# Patient Record
Sex: Female | Born: 1970 | Race: White | Hispanic: No | Marital: Single | Smoking: Former smoker
Health system: Southern US, Community
[De-identification: ages and names within clinical notes are randomized; demographics above are authoritative.]

---

## 2011-02-23 ENCOUNTER — Ambulatory Visit: Payer: Self-pay | Admitting: Internal Medicine

## 2011-03-04 ENCOUNTER — Encounter: Payer: Self-pay | Admitting: Family Medicine

## 2011-03-04 ENCOUNTER — Ambulatory Visit (INDEPENDENT_AMBULATORY_CARE_PROVIDER_SITE_OTHER): Payer: BC Managed Care – PPO | Admitting: Family Medicine

## 2011-03-04 ENCOUNTER — Other Ambulatory Visit (HOSPITAL_COMMUNITY)
Admission: RE | Admit: 2011-03-04 | Discharge: 2011-03-04 | Disposition: A | Discharge status: Home / Self Care | Payer: BC Managed Care – PPO | Source: Ambulatory Visit | Attending: Family Medicine | Admitting: Family Medicine

## 2011-03-04 VITALS — BP 110/80 | HR 76 | Temp 97.8°F | Ht 66.0 in | Wt 129.5 lb

## 2011-03-04 DIAGNOSIS — Z01419 Encounter for gynecological examination (general) (routine) without abnormal findings: Secondary | ICD-10-CM | POA: Insufficient documentation

## 2011-03-04 DIAGNOSIS — Z1231 Encounter for screening mammogram for malignant neoplasm of breast: Secondary | ICD-10-CM

## 2011-03-04 DIAGNOSIS — Z136 Encounter for screening for cardiovascular disorders: Secondary | ICD-10-CM

## 2011-03-04 DIAGNOSIS — Z1159 Encounter for screening for other viral diseases: Secondary | ICD-10-CM | POA: Insufficient documentation

## 2011-03-04 DIAGNOSIS — Z0000 Encounter for general adult medical examination without abnormal findings: Secondary | ICD-10-CM | POA: Insufficient documentation

## 2011-03-04 NOTE — Progress Notes (Signed)
40 yo here to establish care and for CPX/Pap.  No complaints.  G0.  No h/o abnormal pap smears. Grandmother had ovarian CA in her 42s, otherwise no other cancer runs in her family that she is aware of. Has never had a mammogram.  ROS: Patient reports no  vision/ hearing changes,anorexia, weight change, fever ,adenopathy, persistant / recurrent hoarseness, swallowing issues, chest pain, edema,persistant / recurrent cough, hemoptysis, dyspnea(rest, exertional, paroxysmal nocturnal), gastrointestinal  bleeding (melena, rectal bleeding), abdominal pain, excessive heart burn, GU symptoms(dysuria, hematuria, pyuria, voiding/incontinence  Issues) syncope, focal weakness, severe memory loss, concerning skin lesions, depression, anxiety, abnormal bruising/bleeding, major joint swelling, breast masses or abnormal vaginal bleeding.    The PMH, PSH, Social History, Family History, Medications, and allergies have been reviewed in [facility], and have been updated if relevant.  Physical exam: BP 110/80  Pulse 76  Temp(Src) 97.8 F (36.6 C) (Oral)  Ht 5\' 6"  (1.676 m)  Wt 129 lb 8 oz (58.741 kg)  BMI 20.90 kg/m2  LMP 02/26/2011  General:  Well-developed,well-nourished,in no acute distress; alert,appropriate and cooperative throughout examination Head:  normocephalic and atraumatic.   Eyes:  vision grossly intact, pupils equal, pupils round, and pupils reactive to light.   Ears:  R ear normal and L ear normal.   Nose:  no external deformity.   Mouth:  good dentition.   Neck:  No deformities, masses, or tenderness noted. Breasts:  No mass, nodules, thickening, tenderness, bulging, retraction, inflamation, nipple discharge or skin changes noted.   Lungs:  Normal respiratory effort, chest expands symmetrically. Lungs are clear to auscultation, no crackles or wheezes. Heart:  Normal rate and regular rhythm. S1 and S2 normal without gallop, murmur, click, rub or other extra sounds. Abdomen:  Bowel sounds  positive,abdomen soft and non-tender without masses, organomegaly or hernias noted. Rectal:  no external abnormalities.   Genitalia:  Pelvic Exam:        External: normal female genitalia without lesions or masses        Vagina: normal without lesions or masses        Cervix: normal without lesions or masses        Adnexa: normal bimanual exam without masses or fullness        Uterus: normal by palpation        Pap smear: performed Msk:  No deformity or scoliosis noted of thoracic or lumbar spine.   Extremities:  No clubbing, cyanosis, edema, or deformity noted with normal full range of motion of all joints.   Neurologic:  alert & oriented X3 and gait normal.   Skin:  Intact without suspicious lesions or rashes Cervical Nodes:  No lymphadenopathy noted Axillary Nodes:  No palpable lymphadenopathy Psych:  Cognition and judgment appear intact. Alert and cooperative with normal attention span and concentration. No apparent delusions, illusions, hallucinations  Assessment and Plan: 1. Routine general medical examination at a health care facility   Reviewed preventive care protocols, scheduled due services, and updated immunizations Discussed nutrition, exercise, diet, and healthy lifestyle. Orders Placed This Encounter  Procedures  . MM Digital Screening  . Lipid panel  . Basic Metabolic Panel (BMET)

## 2011-03-04 NOTE — Patient Instructions (Signed)
It was wonderful to meet you. Please stop by to see Morgan Whitaker on your way out to set up your mammogram. We will contact you with results of your pap smear and lab work.

## 2011-03-05 LAB — BASIC METABOLIC PANEL
BUN: 11 mg/dL (ref 6–23)
CO2: 27 mEq/L (ref 19–32)
Calcium: 9.4 mg/dL (ref 8.4–10.5)
Creatinine, Ser: 0.8 mg/dL (ref 0.4–1.2)
Glucose, Bld: 92 mg/dL (ref 70–99)

## 2011-03-05 LAB — LIPID PANEL
HDL: 56.4 mg/dL (ref >39.00)
Total CHOL/HDL Ratio: 3
Triglycerides: 80 mg/dL (ref 0.0–149.0)

## 2011-03-12 ENCOUNTER — Encounter: Payer: Self-pay | Admitting: *Deleted

## 2011-04-01 ENCOUNTER — Ambulatory Visit: Payer: Self-pay | Admitting: Family Medicine

## 2011-04-02 ENCOUNTER — Encounter: Payer: Self-pay | Admitting: *Deleted

## 2011-04-05 ENCOUNTER — Encounter: Payer: Self-pay | Admitting: Family Medicine

## 2011-12-21 ENCOUNTER — Encounter: Payer: Self-pay | Admitting: Family Medicine

## 2011-12-21 ENCOUNTER — Ambulatory Visit (INDEPENDENT_AMBULATORY_CARE_PROVIDER_SITE_OTHER): Payer: BC Managed Care – PPO | Admitting: Family Medicine

## 2011-12-21 VITALS — BP 100/72 | HR 72 | Temp 97.9°F | Ht 66.5 in | Wt 135.0 lb

## 2011-12-21 DIAGNOSIS — D239 Other benign neoplasm of skin, unspecified: Secondary | ICD-10-CM

## 2011-12-21 DIAGNOSIS — Z1231 Encounter for screening mammogram for malignant neoplasm of breast: Secondary | ICD-10-CM

## 2011-12-21 DIAGNOSIS — D229 Melanocytic nevi, unspecified: Secondary | ICD-10-CM

## 2011-12-21 DIAGNOSIS — Z0000 Encounter for general adult medical examination without abnormal findings: Secondary | ICD-10-CM

## 2011-12-21 DIAGNOSIS — Z136 Encounter for screening for cardiovascular disorders: Secondary | ICD-10-CM

## 2011-12-21 LAB — COMPREHENSIVE METABOLIC PANEL
AST: 20 U/L (ref 0–37)
Alkaline Phosphatase: 38 U/L — ABNORMAL LOW (ref 39–117)
BUN: 15 mg/dL (ref 6–23)
Creatinine, Ser: 0.8 mg/dL (ref 0.4–1.2)

## 2011-12-21 LAB — LIPID PANEL
HDL: 59.5 mg/dL (ref >39.00)
LDL Cholesterol: 107 mg/dL — ABNORMAL HIGH (ref 0–99)
Total CHOL/HDL Ratio: 3
Triglycerides: 78 mg/dL (ref 0.0–149.0)

## 2011-12-21 MED ORDER — VALACYCLOVIR HCL 1 G PO TABS: 1000.0000 mg | ORAL_TABLET | Freq: Two times a day (BID) | ORAL | Status: AC

## 2011-12-21 NOTE — Progress Notes (Signed)
41 yo here  for CPX/Pap.  Doing well.  Due for labs today.  G0.  No h/o abnormal pap smears.  Normal pap smear last year.  Grandmother had ovarian CA in her 29s, otherwise no other cancer runs in her family that she is aware of.  Multiple moles- she feels some of them are changing - getting larger.   Lab Results  Component Value Date   CHOL 158 03/04/2011   HDL 56.40 03/04/2011   LDLCALC 86 03/04/2011   TRIG 80.0 03/04/2011   CHOLHDL 3 03/04/2011     Patient Active Problem List  Diagnoses  . Routine general medical examination at a health care facility   No past medical history on file. No past surgical history on file. History  Substance Use Topics  . Smoking status: Current Some Day Smoker  . Smokeless tobacco: Not on file  . Alcohol Use: Not on file   Family History  Problem Relation Age of Onset  . Cancer Maternal Grandmother 19    ovarian   No Known Allergies No current outpatient prescriptions on file prior to visit.   The PMH, PSH, Social History, Family History, Medications, and allergies have been reviewed in [facility], and have been updated if relevant.   ROS: Patient reports no  vision/ hearing changes,anorexia, weight change, fever ,adenopathy, persistant / recurrent hoarseness, swallowing issues, chest pain, edema,persistant / recurrent cough, hemoptysis, dyspnea(rest, exertional, paroxysmal nocturnal), gastrointestinal  bleeding (melena, rectal bleeding), abdominal pain, excessive heart burn, GU symptoms(dysuria, hematuria, pyuria, voiding/incontinence  Issues) syncope, focal weakness, severe memory loss, concerning skin lesions, depression, anxiety, abnormal bruising/bleeding, major joint swelling, breast masses or abnormal vaginal bleeding.    The PMH, PSH, Social History, Family History, Medications, and allergies have been reviewed in [facility], and have been updated if relevant.  Physical exam: BP 100/72  Pulse 72  Temp(Src) 97.9 F (36.6 C) (Oral)  Ht 5' 6.5"  (1.689 m)  Wt 135 lb (61.236 kg)  BMI 21.46 kg/m2  LMP 12/12/2011  General:  Well-developed,well-nourished,in no acute distress; alert,appropriate and cooperative throughout examination Head:  normocephalic and atraumatic.   Eyes:  vision grossly intact, pupils equal, pupils round, and pupils reactive to light.   Ears:  R ear normal and L ear normal.   Nose:  no external deformity.   Mouth:  good dentition.   Neck:  No deformities, masses, or tenderness noted. Breasts:  No mass, nodules, thickening, tenderness, bulging, retraction, inflamation, nipple discharge or skin changes noted.   Lungs:  Normal respiratory effort, chest expands symmetrically. Lungs are clear to auscultation, no crackles or wheezes. Heart:  Normal rate and regular rhythm. S1 and S2 normal without gallop, murmur, click, rub or other extra sounds. Abdomen:  Bowel sounds positive,abdomen soft and non-tender without masses, organomegaly or hernias noted. Msk:  No deformity or scoliosis noted of thoracic or lumbar spine.   Extremities:  No clubbing, cyanosis, edema, or deformity noted with normal full range of motion of all joints.   Neurologic:  alert & oriented X3 and gait normal.   Skin:  Multiple moles- some appear dysplastic Cervical Nodes:  No lymphadenopathy noted Axillary Nodes:  No palpable lymphadenopathy Psych:  Cognition and judgment appear intact. Alert and cooperative with normal attention span and concentration. No apparent delusions, illusions, hallucinations  Assessment and Plan:  1. Routine general medical examination at a health care facility  Reviewed preventive care protocols, scheduled due services, and updated immunizations Discussed nutrition, exercise, diet, and healthy lifestyle.  Comprehensive  metabolic panel MM Digital Screening Lipid Panel  2. Atypical nevi  Ambulatory referral to Dermatology

## 2011-12-21 NOTE — Patient Instructions (Addendum)
Great to see you. Please stop by to see Morgan Whitaker on your way out to set up your mammogram. We will call you with your lab results.  Preventive Care for Adults, Female A healthy lifestyle and preventive care can promote health and wellness. Preventive health guidelines for women include the following key practices.  A routine yearly physical is a good way to check with your caregiver about your health and preventive screening. It is a chance to share any concerns and updates on your health, and to receive a thorough exam.   Visit your dentist for a routine exam and preventive care every 6 months. Brush your teeth twice a day and floss once a day. Good oral hygiene prevents tooth decay and gum disease.   The frequency of eye exams is based on your age, health, family medical history, use of contact lenses, and other factors. Follow your caregiver's recommendations for frequency of eye exams.   Eat a healthy diet. Foods like vegetables, fruits, whole grains, low-fat dairy products, and lean protein foods contain the nutrients you need without too many calories. Decrease your intake of foods high in solid fats, added sugars, and salt. Eat the right amount of calories for you.Get information about a proper diet from your caregiver, if necessary.   Regular physical exercise is one of the most important things you can do for your health. Most adults should get at least 150 minutes of moderate-intensity exercise (any activity that increases your heart rate and causes you to sweat) each week. In addition, most adults need muscle-strengthening exercises on 2 or more days a week.   Maintain a healthy weight. The body mass index (BMI) is a screening tool to identify possible weight problems. It provides an estimate of body fat based on height and weight. Your caregiver can help determine your BMI, and can help you achieve or maintain a healthy weight.For adults 20 years and older:   A BMI below 18.5 is  considered underweight.   A BMI of 18.5 to 24.9 is normal.   A BMI of 25 to 29.9 is considered overweight.   A BMI of 30 and above is considered obese.   Maintain normal blood lipids and cholesterol levels by exercising and minimizing your intake of saturated fat. Eat a balanced diet with plenty of fruit and vegetables. Blood tests for lipids and cholesterol should begin at age 19 and be repeated every 5 years. If your lipid or cholesterol levels are high, you are over 50, or you are at high risk for heart disease, you may need your cholesterol levels checked more frequently.Ongoing high lipid and cholesterol levels should be treated with medicines if diet and exercise are not effective.   If you smoke, find out from your caregiver how to quit. If you do not use tobacco, do not start.   If you are pregnant, do not drink alcohol. If you are breastfeeding, be very cautious about drinking alcohol. If you are not pregnant and choose to drink alcohol, do not exceed 1 drink per day. One drink is considered to be 12 ounces (355 mL) of beer, 5 ounces (148 mL) of wine, or 1.5 ounces (44 mL) of liquor.   Avoid use of street drugs. Do not share needles with anyone. Ask for help if you need support or instructions about stopping the use of drugs.   High blood pressure causes heart disease and increases the risk of stroke. Your blood pressure should be checked at least every 1  to 2 years. Ongoing high blood pressure should be treated with medicines if weight loss and exercise are not effective.   If you are 33 to 41 years old, ask your caregiver if you should take aspirin to prevent strokes.   Diabetes screening involves taking a blood sample to check your fasting blood sugar level. This should be done once every 3 years, after age 73, if you are within normal weight and without risk factors for diabetes. Testing should be considered at a younger age or be carried out more frequently if you are overweight  and have at least 1 risk factor for diabetes.   Breast cancer screening is essential preventive care for women. You should practice "breast self-awareness." This means understanding the normal appearance and feel of your breasts and may include breast self-examination. Any changes detected, no matter how small, should be reported to a caregiver. Women in their 8s and 30s should have a clinical breast exam (CBE) by a caregiver as part of a regular health exam every 1 to 3 years. After age 48, women should have a CBE every year. Starting at age 34, women should consider having a mammography (breast X-ray test) every year. Women who have a family history of breast cancer should talk to their caregiver about genetic screening. Women at a high risk of breast cancer should talk to their caregivers about having magnetic resonance imaging (MRI) and a mammography every year.   The Pap test is a screening test for cervical cancer. A Pap test can show cell changes on the cervix that might become cervical cancer if left untreated. A Pap test is a procedure in which cells are obtained and examined from the lower end of the uterus (cervix).   Women should have a Pap test starting at age 89.   Between ages 4 and 56, Pap tests should be repeated every 2 years.   Beginning at age 75, you should have a Pap test every 3 years as long as the past 3 Pap tests have been normal.   Some women have medical problems that increase the chance of getting cervical cancer. Talk to your caregiver about these problems. It is especially important to talk to your caregiver if a new problem develops soon after your last Pap test. In these cases, your caregiver may recommend more frequent screening and Pap tests.   The above recommendations are the same for women who have or have not gotten the vaccine for human papillomavirus (HPV).   If you had a hysterectomy for a problem that was not cancer or a condition that could lead to  cancer, then you no longer need Pap tests. Even if you no longer need a Pap test, a regular exam is a good idea to make sure no other problems are starting.   If you are between ages 54 and 61, and you have had normal Pap tests going back 10 years, you no longer need Pap tests. Even if you no longer need a Pap test, a regular exam is a good idea to make sure no other problems are starting.   If you have had past treatment for cervical cancer or a condition that could lead to cancer, you need Pap tests and screening for cancer for at least 20 years after your treatment.   If Pap tests have been discontinued, risk factors (such as a new sexual partner) need to be reassessed to determine if screening should be resumed.   The HPV test  is an additional test that may be used for cervical cancer screening. The HPV test looks for the virus that can cause the cell changes on the cervix. The cells collected during the Pap test can be tested for HPV. The HPV test could be used to screen women aged 64 years and older, and should be used in women of any age who have unclear Pap test results. After the age of 45, women should have HPV testing at the same frequency as a Pap test.   Colorectal cancer can be detected and often prevented. Most routine colorectal cancer screening begins at the age of 39 and continues through age 12. However, your caregiver may recommend screening at an earlier age if you have risk factors for colon cancer. On a yearly basis, your caregiver may provide home test kits to check for hidden blood in the stool. Use of a small camera at the end of a tube, to directly examine the colon (sigmoidoscopy or colonoscopy), can detect the earliest forms of colorectal cancer. Talk to your caregiver about this at age 11, when routine screening begins. Direct examination of the colon should be repeated every 5 to 10 years through age 20, unless early forms of pre-cancerous polyps or small growths are found.     Hepatitis C blood testing is recommended for all people born from [birth year] through 1965 and any individual with known risks for hepatitis C.   Practice safe sex. Use condoms and avoid high-risk sexual practices to reduce the spread of sexually transmitted infections (STIs). STIs include gonorrhea, chlamydia, syphilis, trichomonas, herpes, HPV, and human immunodeficiency virus (HIV). Herpes, HIV, and HPV are viral illnesses that have no cure. They can result in disability, cancer, and death. Sexually active women aged 43 and younger should be checked for chlamydia. Older women with new or multiple partners should also be tested for chlamydia. Testing for other STIs is recommended if you are sexually active and at increased risk.   Osteoporosis is a disease in which the bones lose minerals and strength with aging. This can result in serious bone fractures. The risk of osteoporosis can be identified using a bone density scan. Women ages 51 and over and women at risk for fractures or osteoporosis should discuss screening with their caregivers. Ask your caregiver whether you should take a calcium supplement or vitamin D to reduce the rate of osteoporosis.   Menopause can be associated with physical symptoms and risks. Hormone replacement therapy is available to decrease symptoms and risks. You should talk to your caregiver about whether hormone replacement therapy is right for you.   Use sunscreen with sun protection factor (SPF) of 30 or more. Apply sunscreen liberally and repeatedly throughout the day. You should seek shade when your shadow is shorter than you. Protect yourself by wearing long sleeves, pants, a wide-brimmed hat, and sunglasses year round, whenever you are outdoors.   Once a month, do a whole body skin exam, using a mirror to look at the skin on your back. Notify your caregiver of new moles, moles that have irregular borders, moles that are larger than a pencil eraser, or moles that have  changed in shape or color.   Stay current with required immunizations.   Influenza. You need a dose every fall (or winter). The composition of the flu vaccine changes each year, so being vaccinated once is not enough.   Pneumococcal polysaccharide. You need 1 to 2 doses if you smoke cigarettes or if you have certain  chronic medical conditions. You need 1 dose at age 53 (or older) if you have never been vaccinated.   Tetanus, diphtheria, pertussis (Tdap, Td). Get 1 dose of Tdap vaccine if you are younger than age 89, are over 31 and have contact with an infant, are a Research scientist (physical sciences), are pregnant, or simply want to be protected from whooping cough. After that, you need a Td booster dose every 10 years. Consult your caregiver if you have not had at least 3 tetanus and diphtheria-containing shots sometime in your life or have a deep or dirty wound.   HPV. You need this vaccine if you are a woman age 68 or younger. The vaccine is given in 3 doses over 6 months.   Measles, mumps, rubella (MMR). You need at least 1 dose of MMR if you were born in 1957 or later. You may also need a second dose.   Meningococcal. If you are age 11 to 50 and a first-year college student living in a residence hall, or have one of several medical conditions, you need to get vaccinated against meningococcal disease. You may also need additional booster doses.   Zoster (shingles). If you are age 38 or older, you should get this vaccine.   Varicella (chickenpox). If you have never had chickenpox or you were vaccinated but received only 1 dose, talk to your caregiver to find out if you need this vaccine.   Hepatitis A. You need this vaccine if you have a specific risk factor for hepatitis A virus infection or you simply wish to be protected from this disease. The vaccine is usually given as 2 doses, 6 to 18 months apart.   Hepatitis B. You need this vaccine if you have a specific risk factor for hepatitis B virus infection  or you simply wish to be protected from this disease. The vaccine is given in 3 doses, usually over 6 months.    Ages 72 to 72  Blood pressure check.** / Every 1 to 2 years.   Lipid and cholesterol check.** / Every 5 years beginning at age 87.   Clinical breast exam.** / Every year after age 97.   Mammogram.** / Every year beginning at age 18 and continuing for as long as you are in good health. Consult with your caregiver.   Pap test.** / Every 3 years starting at age 47 through age 59 or 67 with a history of 3 consecutive normal Pap tests.   HPV screening.** / Every 3 years from ages 30 through ages 62 to 42 with a history of 3 consecutive normal Pap tests.   Fecal occult blood test (FOBT) of stool. / Every year beginning at age 41 and continuing until age 14. You may not need to do this test if you get a colonoscopy every 10 years.   Flexible sigmoidoscopy or colonoscopy.** / Every 5 years for a flexible sigmoidoscopy or every 10 years for a colonoscopy beginning at age 101 and continuing until age 55.   Hepatitis C blood test.** / For all people born from [birth year] through 1965 and any individual with known risks for hepatitis C.   Skin self-exam. / Monthly.   Influenza immunization.** / Every year.   Pneumococcal polysaccharide immunization.** / 1 to 2 doses if you smoke cigarettes or if you have certain chronic medical conditions.   Tetanus, diphtheria, pertussis (Tdap, Td) immunization.** / A one-time dose of Tdap vaccine. After that, you need a Td booster dose every 10 years.  Measles, mumps, rubella (MMR) immunization. / You need at least 1 dose of MMR if you were born in 1957 or later. You may also need a second dose.   Varicella immunization.** / Consult your caregiver.   Meningococcal immunization.** / Consult your caregiver.   Hepatitis A immunization.** / Consult your caregiver. 2 doses, 6 to 18 months apart.   Hepatitis B immunization.** / Consult your caregiver. 3  doses, usually over 6 months.  Ages 84 and over  Blood pressure check.** / Every 1 to 2 years.   Lipid and cholesterol check.** / Every 5 years beginning at age 59.   Clinical breast exam.** / Every year after age 8.   Mammogram.** / Every year beginning at age 82 and continuing for as long as you are in good health. Consult with your caregiver.   Pap test.** / Every 3 years starting at age 76 through age 64 or 11 with a 3 consecutive normal Pap tests. Testing can be stopped between 65 and 70 with 3 consecutive normal Pap tests and no abnormal Pap or HPV tests in the past 10 years.   HPV screening.** / Every 3 years from ages 56 through ages 57 or 2 with a history of 3 consecutive normal Pap tests. Testing can be stopped between 65 and 70 with 3 consecutive normal Pap tests and no abnormal Pap or HPV tests in the past 10 years.   Fecal occult blood test (FOBT) of stool. / Every year beginning at age 58 and continuing until age 64. You may not need to do this test if you get a colonoscopy every 10 years.   Flexible sigmoidoscopy or colonoscopy.** / Every 5 years for a flexible sigmoidoscopy or every 10 years for a colonoscopy beginning at age 52 and continuing until age 42.   Hepatitis C blood test.** / For all people born from [birth year] through 1965 and any individual with known risks for hepatitis C.   Osteoporosis screening.** / A one-time screening for women ages 38 and over and women at risk for fractures or osteoporosis.   Skin self-exam. / Monthly.   Influenza immunization.** / Every year.   Pneumococcal polysaccharide immunization.** / 1 dose at age 61 (or older) if you have never been vaccinated.   Tetanus, diphtheria, pertussis (Tdap, Td) immunization. / A one-time dose of Tdap vaccine if you are over 65 and have contact with an infant, are a Research scientist (physical sciences), or simply want to be protected from whooping cough. After that, you need a Td booster dose every 10 years.   Varicella  immunization.** / Consult your caregiver.   Meningococcal immunization.** / Consult your caregiver.   Hepatitis A immunization.** / Consult your caregiver. 2 doses, 6 to 18 months apart.   Hepatitis B immunization.** / Check with your caregiver. 3 doses, usually over 6 months.  ** Family history and personal history of risk and conditions may change your caregiver's recommendations. Document Released: 10/19/2001 Document Revised: 08/12/2011 Document Reviewed: 01/18/2011 [facility] Patient Information 2012 [place], [place].

## 2012-04-04 ENCOUNTER — Ambulatory Visit: Payer: Self-pay | Admitting: Family Medicine

## 2012-04-05 ENCOUNTER — Encounter: Payer: Self-pay | Admitting: Family Medicine

## 2012-04-06 ENCOUNTER — Encounter: Payer: Self-pay | Admitting: *Deleted

## 2012-04-06 ENCOUNTER — Encounter: Payer: Self-pay | Admitting: Family Medicine

## 2012-04-06 LAB — HM MAMMOGRAPHY: HM Mammogram: NORMAL

## 2013-03-28 ENCOUNTER — Telehealth: Payer: Self-pay

## 2013-03-28 DIAGNOSIS — Z1231 Encounter for screening mammogram for malignant neoplasm of breast: Secondary | ICD-10-CM

## 2013-03-28 NOTE — Telephone Encounter (Signed)
Pt request screening mammogram to be ordered prior to 06/05/13 CPX. Heather at [facility] said since pt has not been seen since 12/2011 will need referral from doctor. Pt will wait to hear from pt care coordinator.

## 2013-03-28 NOTE — Telephone Encounter (Signed)
Referral placed.

## 2013-05-17 ENCOUNTER — Ambulatory Visit: Payer: Self-pay | Admitting: Family Medicine

## 2013-05-18 ENCOUNTER — Encounter: Payer: Self-pay | Admitting: Family Medicine

## 2013-05-18 ENCOUNTER — Encounter: Payer: Self-pay | Admitting: *Deleted

## 2013-05-28 ENCOUNTER — Encounter: Payer: Self-pay | Admitting: Family Medicine

## 2013-06-05 ENCOUNTER — Encounter: Payer: Self-pay | Admitting: Family Medicine

## 2013-06-05 ENCOUNTER — Ambulatory Visit (INDEPENDENT_AMBULATORY_CARE_PROVIDER_SITE_OTHER): Payer: BC Managed Care – PPO | Admitting: Family Medicine

## 2013-06-05 ENCOUNTER — Other Ambulatory Visit (HOSPITAL_COMMUNITY)
Admission: RE | Admit: 2013-06-05 | Discharge: 2013-06-05 | Disposition: A | Discharge status: Home / Self Care | Payer: BC Managed Care – PPO | Source: Ambulatory Visit | Attending: Family Medicine | Admitting: Family Medicine

## 2013-06-05 VITALS — BP 106/78 | HR 87 | Temp 97.7°F | Wt 141.2 lb

## 2013-06-05 DIAGNOSIS — Z136 Encounter for screening for cardiovascular disorders: Secondary | ICD-10-CM

## 2013-06-05 DIAGNOSIS — Z23 Encounter for immunization: Secondary | ICD-10-CM

## 2013-06-05 DIAGNOSIS — Z0000 Encounter for general adult medical examination without abnormal findings: Secondary | ICD-10-CM

## 2013-06-05 DIAGNOSIS — Z01419 Encounter for gynecological examination (general) (routine) without abnormal findings: Secondary | ICD-10-CM | POA: Insufficient documentation

## 2013-06-05 LAB — CBC WITH DIFFERENTIAL/PLATELET
Basophils Relative: 0.6 % (ref 0.0–3.0)
Eosinophils Absolute: 0.2 10*3/uL (ref 0.0–0.7)
MCHC: 33.8 g/dL (ref 30.0–36.0)
MCV: 96.9 fl (ref 78.0–100.0)
Monocytes Absolute: 0.6 10*3/uL (ref 0.1–1.0)
Neutrophils Relative %: 63.3 % (ref 43.0–77.0)
Platelets: 318 10*3/uL (ref 150.0–400.0)
RDW: 12.5 % (ref 11.5–14.6)

## 2013-06-05 LAB — LIPID PANEL
LDL Cholesterol: 107 mg/dL — ABNORMAL HIGH (ref 0–99)
Total CHOL/HDL Ratio: 4

## 2013-06-05 LAB — COMPREHENSIVE METABOLIC PANEL
ALT: 13 U/L (ref 0–35)
AST: 18 U/L (ref 0–37)
Albumin: 4.4 g/dL (ref 3.5–5.2)
Calcium: 9.3 mg/dL (ref 8.4–10.5)
Chloride: 104 mEq/L (ref 96–112)
Potassium: 4.1 mEq/L (ref 3.5–5.1)
Total Protein: 7.9 g/dL (ref 6.0–8.3)

## 2013-06-05 LAB — LUTEINIZING HORMONE: LH: 11.55 m[IU]/mL

## 2013-06-05 NOTE — Addendum Note (Signed)
Addended by: Damita Lack on: 06/05/2013 09:45 AM   Modules accepted: Orders

## 2013-06-05 NOTE — Patient Instructions (Addendum)
Great to see you. We will call you with your lab results.  You can also view them online.   

## 2013-06-05 NOTE — Progress Notes (Signed)
42 yo here  for CPX.  Doing well.  Due for labs today.  G0.  No h/o abnormal pap smears. Sexually active with her partner only.  Her partner has noticed that her periods seem to be more irregular lately.  Normal pap smear in 02/2011 (done by me). Negative mammogram 05/20/2013.  Grandmother had ovarian CA in her 64s, otherwise no other cancer runs in her family that she is aware of.  Has dermatologist that she sees every 6 months.  Lab Results  Component Value Date   CHOL 182 12/21/2011   HDL 59.50 12/21/2011   LDLCALC 107* 12/21/2011   TRIG 78.0 12/21/2011   CHOLHDL 3 12/21/2011     Patient Active Problem List   Diagnosis Date Noted  . Routine general medical examination at a health care facility 03/04/2011   No past medical history on file. No past surgical history on file. History  Substance Use Topics  . Smoking status: Former Games developer  . Smokeless tobacco: Not on file     Comment: Quit 12/12/11  . Alcohol Use: Not on file   Family History  Problem Relation Age of Onset  . Cancer Maternal Grandmother 31    ovarian   No Known Allergies No current outpatient prescriptions on file prior to visit.   No current facility-administered medications on file prior to visit.   The PMH, PSH, Social History, Family History, Medications, and allergies have been reviewed in [facility], and have been updated if relevant.   ROS: Patient reports no  vision/ hearing changes,anorexia, weight change, fever ,adenopathy, persistant / recurrent hoarseness, swallowing issues, chest pain, edema,persistant / recurrent cough, hemoptysis, dyspnea(rest, exertional, paroxysmal nocturnal), gastrointestinal  bleeding (melena, rectal bleeding), abdominal pain, excessive heart burn, GU symptoms(dysuria, hematuria, pyuria, voiding/incontinence  Issues) syncope, focal weakness, severe memory loss, concerning skin lesions, depression, anxiety, abnormal bruising/bleeding, major joint swelling, breast masses or abnormal  vaginal bleeding.    The PMH, PSH, Social History, Family History, Medications, and allergies have been reviewed in [facility], and have been updated if relevant.  Physical exam: BP 106/78  Pulse 87  Temp(Src) 97.7 F (36.5 C) (Oral)  Wt 141 lb 4 oz (64.071 kg)  BMI 22.46 kg/m2  SpO2 97%  LMP 05/28/2013   General:  Well-developed,well-nourished,in no acute distress; alert,appropriate and cooperative throughout examination Head:  normocephalic and atraumatic.   Eyes:  vision grossly intact, pupils equal, pupils round, and pupils reactive to light.   Ears:  R ear normal and L ear normal.   Nose:  no external deformity.   Mouth:  good dentition.   Neck:  No deformities, masses, or tenderness noted. Breasts:  No mass, nodules, thickening, tenderness, bulging, retraction, inflamation, nipple discharge or skin changes noted.   Lungs:  Normal respiratory effort, chest expands symmetrically. Lungs are clear to auscultation, no crackles or wheezes. Heart:  Normal rate and regular rhythm. S1 and S2 normal without gallop, murmur, click, rub or other extra sounds. Abdomen:  Bowel sounds positive,abdomen soft and non-tender without masses, organomegaly or hernias noted. Rectal:  no external abnormalities.   Genitalia:  Pelvic Exam:        External: normal female genitalia without lesions or masses        Vagina: normal without lesions or masses        Cervix: normal without lesions or masses        Adnexa: normal bimanual exam without masses or fullness        Uterus: normal by palpation  Pap smear: performed Msk:  No deformity or scoliosis noted of thoracic or lumbar spine.   Extremities:  No clubbing, cyanosis, edema, or deformity noted with normal full range of motion of all joints.   Neurologic:  alert & oriented X3 and gait normal.   Skin:  Intact without suspicious lesions or rashes Cervical Nodes:  No lymphadenopathy noted Axillary Nodes:  No palpable lymphadenopathy Psych:   Cognition and judgment appear intact. Alert and cooperative with normal attention span and concentration. No apparent delusions, illusions, hallucinations  Assessment and Plan:  1. Routine general medical examination at a health care facility Reviewed preventive care protocols, scheduled due services, and updated immunizations Discussed nutrition, exercise, diet, and healthy lifestyle.  Discussed USPSTF recommendations of cervical cancer screening.  She is aware that interval of 3 years is recommended but pt would prefer to have pap smear done today.  - Comprehensive metabolic panel - TSH - CBC with Differential - Luteinizing hormone - Follicle Stimulating Hormone - Cytology - PAP  2. Screening for ischemic heart disease  - Lipid Panel

## 2013-06-06 ENCOUNTER — Encounter: Payer: Self-pay | Admitting: Family Medicine

## 2014-06-14 ENCOUNTER — Encounter: Payer: BC Managed Care – PPO | Admitting: Family Medicine

## 2014-06-18 ENCOUNTER — Encounter: Payer: Self-pay | Admitting: Family Medicine

## 2014-06-18 ENCOUNTER — Other Ambulatory Visit (HOSPITAL_COMMUNITY)
Admission: RE | Admit: 2014-06-18 | Discharge: 2014-06-18 | Disposition: A | Discharge status: Home / Self Care | Payer: BC Managed Care – PPO | Source: Ambulatory Visit | Attending: Family Medicine | Admitting: Family Medicine

## 2014-06-18 ENCOUNTER — Ambulatory Visit (INDEPENDENT_AMBULATORY_CARE_PROVIDER_SITE_OTHER): Payer: BC Managed Care – PPO | Admitting: Family Medicine

## 2014-06-18 VITALS — BP 106/62 | HR 74 | Temp 98.4°F | Ht 66.25 in | Wt 142.8 lb

## 2014-06-18 DIAGNOSIS — Z23 Encounter for immunization: Secondary | ICD-10-CM

## 2014-06-18 DIAGNOSIS — Z01419 Encounter for gynecological examination (general) (routine) without abnormal findings: Secondary | ICD-10-CM | POA: Diagnosis present

## 2014-06-18 DIAGNOSIS — Z1151 Encounter for screening for human papillomavirus (HPV): Secondary | ICD-10-CM | POA: Diagnosis present

## 2014-06-18 DIAGNOSIS — Z1239 Encounter for other screening for malignant neoplasm of breast: Secondary | ICD-10-CM

## 2014-06-18 LAB — LIPID PANEL
Cholesterol: 203 mg/dL — ABNORMAL HIGH (ref 0–200)
HDL: 53.4 mg/dL (ref >39.00)
LDL Cholesterol: 123 mg/dL — ABNORMAL HIGH (ref 0–99)
NonHDL: 149.6
Total CHOL/HDL Ratio: 4
Triglycerides: 134 mg/dL (ref 0.0–149.0)
VLDL: 26.8 mg/dL (ref 0.0–40.0)

## 2014-06-18 LAB — CBC WITH DIFFERENTIAL/PLATELET
BASOS ABS: 0 10*3/uL (ref 0.0–0.1)
Basophils Relative: 0.5 % (ref 0.0–3.0)
Eosinophils Absolute: 0.1 10*3/uL (ref 0.0–0.7)
Eosinophils Relative: 1.1 % (ref 0.0–5.0)
HCT: 40.2 % (ref 36.0–46.0)
Hemoglobin: 13.6 g/dL (ref 12.0–15.0)
LYMPHS PCT: 25.7 % (ref 12.0–46.0)
Lymphs Abs: 1.7 10*3/uL (ref 0.7–4.0)
MCHC: 33.8 g/dL (ref 30.0–36.0)
MCV: 97.6 fl (ref 78.0–100.0)
MONOS PCT: 8 % (ref 3.0–12.0)
Monocytes Absolute: 0.5 10*3/uL (ref 0.1–1.0)
NEUTROS ABS: 4.3 10*3/uL (ref 1.4–7.7)
Neutrophils Relative %: 64.7 % (ref 43.0–77.0)
Platelets: 294 10*3/uL (ref 150.0–400.0)
RBC: 4.12 Mil/uL (ref 3.87–5.11)
RDW: 12.5 % (ref 11.5–15.5)
WBC: 6.6 10*3/uL (ref 4.0–10.5)

## 2014-06-18 LAB — COMPREHENSIVE METABOLIC PANEL
ALBUMIN: 4.1 g/dL (ref 3.5–5.2)
ALT: 14 U/L (ref 0–35)
AST: 23 U/L (ref 0–37)
Alkaline Phosphatase: 43 U/L (ref 39–117)
BUN: 11 mg/dL (ref 6–23)
CHLORIDE: 102 meq/L (ref 96–112)
CO2: 26 meq/L (ref 19–32)
Calcium: 9.5 mg/dL (ref 8.4–10.5)
Creatinine, Ser: 0.9 mg/dL (ref 0.4–1.2)
GFR: 75.27 mL/min (ref >60.00)
Glucose, Bld: 86 mg/dL (ref 70–99)
POTASSIUM: 4.1 meq/L (ref 3.5–5.1)
Sodium: 135 mEq/L (ref 135–145)
Total Bilirubin: 0.7 mg/dL (ref 0.2–1.2)
Total Protein: 8.6 g/dL — ABNORMAL HIGH (ref 6.0–8.3)

## 2014-06-18 LAB — TSH: TSH: 4.59 u[IU]/mL — AB (ref 0.35–4.50)

## 2014-06-18 LAB — FOLLICLE STIMULATING HORMONE: FSH: 6.8 m[IU]/mL

## 2014-06-18 LAB — LUTEINIZING HORMONE: LH: 7.1 m[IU]/mL

## 2014-06-18 NOTE — Progress Notes (Signed)
43 yo here  for CPX.  Doing well.  Due for labs today.  G0.  No h/o abnormal pap smears. Sexually active with her partner only. Normal pap smear 06/05/13 (done by me). Negative mammogram 05/17/2013.  Grandmother had ovarian CA in her 47s, otherwise no other cancer runs in her family that she is aware of.  Has dermatologist that she sees every 6 months.  Lab Results  Component Value Date   CHOL 188 06/05/2013   HDL 51.80 06/05/2013   LDLCALC 107* 06/05/2013   TRIG 148.0 06/05/2013   CHOLHDL 4 06/05/2013    Lab Results  Component Value Date   CREATININE 0.8 06/05/2013   Lab Results  Component Value Date   TSH 3.92 06/05/2013   Lab Results  Component Value Date   WBC 6.6 06/05/2013   HGB 13.7 06/05/2013   HCT 40.4 06/05/2013   MCV 96.9 06/05/2013   PLT 318.0 06/05/2013    Patient Active Problem List   Diagnosis Date Noted  . Well woman exam with routine gynecological exam 06/18/2014  . Routine general medical examination at a health care facility 03/04/2011   No past medical history on file. No past surgical history on file. History  Substance Use Topics  . Smoking status: Former Research scientist (life sciences)  . Smokeless tobacco: Not on file     Comment: Quit 12/12/11  . Alcohol Use: Not on file   Family History  Problem Relation Age of Onset  . Cancer Maternal Grandmother 72    ovarian   No Known Allergies Current Outpatient Prescriptions on File Prior to Visit  Medication Sig Dispense Refill  . valACYclovir (VALTREX) 500 MG tablet Take 500 mg by mouth as needed.       No current facility-administered medications on file prior to visit.   The PMH, PSH, Social History, Family History, Medications, and allergies have been reviewed in [facility], and have been updated if relevant.   ROS: Patient reports no  vision/ hearing changes,anorexia, weight change, fever ,adenopathy, persistant / recurrent hoarseness, swallowing issues, chest pain, edema,persistant / recurrent cough, hemoptysis, dyspnea(rest,  exertional, paroxysmal nocturnal), gastrointestinal  bleeding (melena, rectal bleeding), abdominal pain, excessive heart burn, GU symptoms(dysuria, hematuria, pyuria, voiding/incontinence  Issues) syncope, focal weakness, severe memory loss, concerning skin lesions, depression, anxiety, abnormal bruising/bleeding, major joint swelling, breast masses or abnormal vaginal bleeding.    The PMH, PSH, Social History, Family History, Medications, and allergies have been reviewed in [facility], and have been updated if relevant.  Physical exam: BP 106/62  Pulse 74  Temp(Src) 98.4 F (36.9 C) (Oral)  Ht 5' 6.25" (1.683 m)  Wt 142 lb 12 oz (64.751 kg)  BMI 22.86 kg/m2  SpO2 98%  LMP 06/03/2014  Wt Readings from Last 3 Encounters:  06/18/14 142 lb 12 oz (64.751 kg)  06/05/13 141 lb 4 oz (64.071 kg)  12/21/11 135 lb (61.236 kg)    General:  Well-developed,well-nourished,in no acute distress; alert,appropriate and cooperative throughout examination Head:  normocephalic and atraumatic.   Eyes:  vision grossly intact, pupils equal, pupils round, and pupils reactive to light.   Ears:  R ear normal and L ear normal.   Nose:  no external deformity.   Mouth:  good dentition.   Neck:  No deformities, masses, or tenderness noted. Breasts:  No mass, nodules, thickening, tenderness, bulging, retraction, inflamation, nipple discharge or skin changes noted.   Lungs:  Normal respiratory effort, chest expands symmetrically. Lungs are clear to auscultation, no crackles or wheezes. Heart:  Normal rate and regular rhythm. S1 and S2 normal without gallop, murmur, click, rub or other extra sounds. Abdomen:  Bowel sounds positive,abdomen soft and non-tender without masses, organomegaly or hernias noted. Rectal:  no external abnormalities.   Genitalia:  Pelvic Exam:        External: normal female genitalia without lesions or masses        Vagina: normal without lesions or masses        Cervix: normal without lesions or  masses        Adnexa: normal bimanual exam without masses or fullness        Uterus: normal by palpation        Pap smear: performed Msk:  No deformity or scoliosis noted of thoracic or lumbar spine.   Extremities:  No clubbing, cyanosis, edema, or deformity noted with normal full range of motion of all joints.   Neurologic:  alert & oriented X3 and gait normal.   Skin:  Intact without suspicious lesions or rashes Cervical Nodes:  No lymphadenopathy noted Axillary Nodes:  No palpable lymphadenopathy Psych:  Cognition and judgment appear intact. Alert and cooperative with normal attention span and concentration. No apparent delusions, illusions, hallucinations

## 2014-06-18 NOTE — Assessment & Plan Note (Signed)
Reviewed preventive care protocols, scheduled due services, and updated immunizations Discussed nutrition, exercise, diet, and healthy lifestyle.  Discussed USPSTF recommendations of cervical cancer screening.  She is aware that interval of 3 years is recommended but pt would prefer to have pap smear done today.  Orders Placed This Encounter  Procedures  . MM Digital Screening  . Flu Vaccine QUAD 36+ mos PF IM (Fluarix Quad PF)  . CBC with Differential  . Comprehensive metabolic panel  . Lipid panel  . TSH  . Follicle Stimulating Hormone  . Luteinizing hormone

## 2014-06-18 NOTE — Progress Notes (Signed)
Pre visit review using our clinic review tool, if applicable. No additional management support is needed unless otherwise documented below in the visit note. 

## 2014-06-18 NOTE — Patient Instructions (Signed)
Great to see you. We will call you with your lab results from today and you can view them online.   

## 2014-06-19 ENCOUNTER — Encounter: Payer: Self-pay | Admitting: Family Medicine

## 2014-06-19 LAB — CYTOLOGY - PAP

## 2014-06-20 ENCOUNTER — Encounter: Payer: Self-pay | Admitting: Family Medicine

## 2014-07-19 ENCOUNTER — Ambulatory Visit: Payer: Self-pay | Admitting: Family Medicine

## 2014-07-19 ENCOUNTER — Encounter: Payer: Self-pay | Admitting: Family Medicine

## 2014-09-19 ENCOUNTER — Other Ambulatory Visit: Payer: Self-pay | Admitting: Family Medicine

## 2014-09-19 DIAGNOSIS — R7989 Other specified abnormal findings of blood chemistry: Secondary | ICD-10-CM

## 2014-09-25 ENCOUNTER — Encounter: Payer: Self-pay | Admitting: Family Medicine

## 2014-09-25 ENCOUNTER — Other Ambulatory Visit (INDEPENDENT_AMBULATORY_CARE_PROVIDER_SITE_OTHER): Payer: BC Managed Care – PPO

## 2014-09-25 DIAGNOSIS — R946 Abnormal results of thyroid function studies: Secondary | ICD-10-CM

## 2014-09-25 DIAGNOSIS — R7989 Other specified abnormal findings of blood chemistry: Secondary | ICD-10-CM

## 2014-09-25 LAB — TSH: TSH: 8.79 u[IU]/mL — ABNORMAL HIGH (ref 0.35–4.50)

## 2014-09-26 ENCOUNTER — Telehealth: Payer: Self-pay | Admitting: Family Medicine

## 2014-09-26 ENCOUNTER — Other Ambulatory Visit: Payer: Self-pay | Admitting: Internal Medicine

## 2014-09-26 DIAGNOSIS — R7989 Other specified abnormal findings of blood chemistry: Secondary | ICD-10-CM

## 2014-09-26 NOTE — Telephone Encounter (Signed)
Pt calling in and received her lab results via [place] but has some questions about the lab results and would like to speak with someone about it. Pt was informed that Dr Deborra Medina is out of the office and Im not sure if she will receive a reply today or when Dr Deborra Medina returns on Monday. Please advise! Thank you!

## 2014-09-26 NOTE — Telephone Encounter (Signed)
Called and spoke with pt.  She wants to repeat labs in 4 weeks, TSH and free T4 ordered She is interested in having thyroid ultrasound done- ordered- Will forward to [place] to schedule (she lives and works in [place] so that area would be convenient for her)

## 2014-10-01 ENCOUNTER — Encounter: Payer: Self-pay | Admitting: Internal Medicine

## 2014-10-01 ENCOUNTER — Ambulatory Visit: Payer: Self-pay | Admitting: Internal Medicine

## 2014-10-04 ENCOUNTER — Other Ambulatory Visit: Payer: Self-pay | Admitting: Family Medicine

## 2014-10-04 DIAGNOSIS — N951 Menopausal and female climacteric states: Secondary | ICD-10-CM

## 2014-10-31 ENCOUNTER — Encounter: Payer: Self-pay | Admitting: Family Medicine

## 2014-10-31 ENCOUNTER — Other Ambulatory Visit (INDEPENDENT_AMBULATORY_CARE_PROVIDER_SITE_OTHER): Payer: BC Managed Care – PPO

## 2014-10-31 DIAGNOSIS — R946 Abnormal results of thyroid function studies: Secondary | ICD-10-CM

## 2014-10-31 DIAGNOSIS — R7989 Other specified abnormal findings of blood chemistry: Secondary | ICD-10-CM

## 2014-10-31 DIAGNOSIS — N951 Menopausal and female climacteric states: Secondary | ICD-10-CM

## 2014-10-31 LAB — TSH: TSH: 7.13 u[IU]/mL — ABNORMAL HIGH (ref 0.35–4.50)

## 2014-10-31 LAB — FOLLICLE STIMULATING HORMONE: FSH: 5.2 m[IU]/mL

## 2014-10-31 LAB — T3, FREE: T3, Free: 2.6 pg/mL (ref 2.3–4.2)

## 2014-10-31 LAB — T4, FREE: Free T4: 0.76 ng/dL (ref 0.60–1.60)

## 2014-10-31 LAB — LUTEINIZING HORMONE: LH: 7.47 m[IU]/mL

## 2014-11-01 ENCOUNTER — Other Ambulatory Visit: Payer: Self-pay | Admitting: Family Medicine

## 2014-11-01 MED ORDER — LEVOTHYROXINE SODIUM 25 MCG PO TABS: 25.0000 ug | ORAL_TABLET | Freq: Every day | ORAL | Status: DC

## 2015-01-07 ENCOUNTER — Other Ambulatory Visit: Payer: Self-pay | Admitting: Family Medicine

## 2015-01-07 DIAGNOSIS — R7989 Other specified abnormal findings of blood chemistry: Secondary | ICD-10-CM

## 2015-01-08 ENCOUNTER — Other Ambulatory Visit (INDEPENDENT_AMBULATORY_CARE_PROVIDER_SITE_OTHER): Payer: BC Managed Care – PPO

## 2015-01-08 DIAGNOSIS — R946 Abnormal results of thyroid function studies: Secondary | ICD-10-CM | POA: Diagnosis not present

## 2015-01-08 DIAGNOSIS — R7989 Other specified abnormal findings of blood chemistry: Secondary | ICD-10-CM

## 2015-01-08 LAB — T3, FREE: T3 FREE: 2.3 pg/mL (ref 2.3–4.2)

## 2015-01-08 LAB — TSH: TSH: 4.41 u[IU]/mL (ref 0.35–4.50)

## 2015-01-08 LAB — T4, FREE: Free T4: 0.72 ng/dL (ref 0.60–1.60)

## 2015-01-09 ENCOUNTER — Encounter: Payer: Self-pay | Admitting: Family Medicine

## 2015-01-30 ENCOUNTER — Other Ambulatory Visit: Payer: Self-pay | Admitting: Family Medicine

## 2015-05-27 ENCOUNTER — Other Ambulatory Visit: Payer: Self-pay | Admitting: Family Medicine

## 2015-05-27 DIAGNOSIS — Z1231 Encounter for screening mammogram for malignant neoplasm of breast: Secondary | ICD-10-CM

## 2015-06-25 ENCOUNTER — Encounter: Payer: Self-pay | Admitting: Family Medicine

## 2015-06-25 ENCOUNTER — Other Ambulatory Visit (HOSPITAL_COMMUNITY)
Admission: RE | Admit: 2015-06-25 | Discharge: 2015-06-25 | Disposition: A | Discharge status: Home / Self Care | Payer: BC Managed Care – PPO | Source: Ambulatory Visit | Attending: Family Medicine | Admitting: Family Medicine

## 2015-06-25 ENCOUNTER — Ambulatory Visit (INDEPENDENT_AMBULATORY_CARE_PROVIDER_SITE_OTHER): Payer: BC Managed Care – PPO | Admitting: Family Medicine

## 2015-06-25 VITALS — BP 112/62 | HR 82 | Temp 98.1°F | Wt 143.8 lb

## 2015-06-25 DIAGNOSIS — Z01419 Encounter for gynecological examination (general) (routine) without abnormal findings: Secondary | ICD-10-CM | POA: Insufficient documentation

## 2015-06-25 DIAGNOSIS — Z23 Encounter for immunization: Secondary | ICD-10-CM

## 2015-06-25 DIAGNOSIS — Z1151 Encounter for screening for human papillomavirus (HPV): Secondary | ICD-10-CM | POA: Insufficient documentation

## 2015-06-25 DIAGNOSIS — Z0000 Encounter for general adult medical examination without abnormal findings: Secondary | ICD-10-CM

## 2015-06-25 DIAGNOSIS — E039 Hypothyroidism, unspecified: Secondary | ICD-10-CM | POA: Insufficient documentation

## 2015-06-25 LAB — COMPREHENSIVE METABOLIC PANEL
ALT: 14 U/L (ref 0–35)
AST: 18 U/L (ref 0–37)
Albumin: 4.5 g/dL (ref 3.5–5.2)
Alkaline Phosphatase: 38 U/L — ABNORMAL LOW (ref 39–117)
BUN: 12 mg/dL (ref 6–23)
CO2: 26 meq/L (ref 19–32)
Calcium: 9.8 mg/dL (ref 8.4–10.5)
Chloride: 103 mEq/L (ref 96–112)
Creatinine, Ser: 0.81 mg/dL (ref 0.40–1.20)
GFR: 81.35 mL/min (ref >60.00)
GLUCOSE: 100 mg/dL — AB (ref 70–99)
Potassium: 4.5 mEq/L (ref 3.5–5.1)
Sodium: 137 mEq/L (ref 135–145)
Total Bilirubin: 0.6 mg/dL (ref 0.2–1.2)
Total Protein: 8.2 g/dL (ref 6.0–8.3)

## 2015-06-25 LAB — CBC WITH DIFFERENTIAL/PLATELET
BASOS ABS: 0 10*3/uL (ref 0.0–0.1)
BASOS PCT: 0.5 % (ref 0.0–3.0)
EOS ABS: 0.1 10*3/uL (ref 0.0–0.7)
Eosinophils Relative: 2.2 % (ref 0.0–5.0)
HEMATOCRIT: 41.1 % (ref 36.0–46.0)
Hemoglobin: 13.8 g/dL (ref 12.0–15.0)
LYMPHS ABS: 1.2 10*3/uL (ref 0.7–4.0)
LYMPHS PCT: 22.7 % (ref 12.0–46.0)
MCHC: 33.5 g/dL (ref 30.0–36.0)
MCV: 98.2 fl (ref 78.0–100.0)
Monocytes Absolute: 0.5 10*3/uL (ref 0.1–1.0)
Monocytes Relative: 9.2 % (ref 3.0–12.0)
NEUTROS ABS: 3.4 10*3/uL (ref 1.4–7.7)
NEUTROS PCT: 65.4 % (ref 43.0–77.0)
PLATELETS: 281 10*3/uL (ref 150.0–400.0)
RBC: 4.18 Mil/uL (ref 3.87–5.11)
RDW: 13 % (ref 11.5–15.5)
WBC: 5.2 10*3/uL (ref 4.0–10.5)

## 2015-06-25 LAB — LIPID PANEL
CHOL/HDL RATIO: 3
Cholesterol: 195 mg/dL (ref 0–200)
HDL: 59.1 mg/dL (ref >39.00)
LDL Cholesterol: 117 mg/dL — ABNORMAL HIGH (ref 0–99)
NonHDL: 136.26
Triglycerides: 95 mg/dL (ref 0.0–149.0)
VLDL: 19 mg/dL (ref 0.0–40.0)

## 2015-06-25 LAB — T4, FREE: FREE T4: 0.76 ng/dL (ref 0.60–1.60)

## 2015-06-25 LAB — TSH: TSH: 5.95 u[IU]/mL — AB (ref 0.35–4.50)

## 2015-06-25 NOTE — Progress Notes (Signed)
Pre visit review using our clinic review tool, if applicable. No additional management support is needed unless otherwise documented below in the visit note. 

## 2015-06-25 NOTE — Assessment & Plan Note (Addendum)
Reviewed preventive care protocols, scheduled due services, and updated immunizations Discussed nutrition, exercise, diet, and healthy lifestyle.  Discussed USPSTF recommendations of cervical cancer screening.  She is aware that interval of 3 years is recommended but pt would prefer to have pap smear done today.  Influenza vaccine given today. Mammogram scheduled.  Orders Placed This Encounter  Procedures  . CBC with Differential/Platelet  . Comprehensive metabolic panel  . Lipid panel  . TSH  . HIV antibody (with reflex)  . T4, Free

## 2015-06-25 NOTE — Assessment & Plan Note (Signed)
Clinically euthyroid. Due for labs today. 

## 2015-06-25 NOTE — Progress Notes (Signed)
44 yo pleasant female for CPX.  Due for labs today.   G0.  No h/o abnormal pap smears. Sexually active with her partner only. Normal pap smear 06/18/14 (done by me). Negative mammogram 07/19/14, has one scheduled for 07/29/15.  Grandmother had ovarian CA in her 53s, otherwise no other cancer runs in her family that she is aware of.  Has dermatologist that she sees every 6 months.  Hypothyroidism- currently taking synthroid 25 mcg daily. Denies any symptoms of hyper or hypothyroidism. Lab Results  Component Value Date   TSH 4.41 01/08/2015    Lab Results  Component Value Date   CHOL 203* 06/18/2014   HDL 53.40 06/18/2014   LDLCALC 123* 06/18/2014   TRIG 134.0 06/18/2014   CHOLHDL 4 06/18/2014    Lab Results  Component Value Date   CREATININE 0.9 06/18/2014    Lab Results  Component Value Date   WBC 6.6 06/18/2014   HGB 13.6 06/18/2014   HCT 40.2 06/18/2014   MCV 97.6 06/18/2014   PLT 294.0 06/18/2014    Patient Active Problem List   Diagnosis Date Noted  . Well woman exam with routine gynecological exam 06/18/2014   No past medical history on file. No past surgical history on file. Social History  Substance Use Topics  . Smoking status: Former Research scientist (life sciences)  . Smokeless tobacco: None     Comment: Quit 12/12/11  . Alcohol Use: None   Family History  Problem Relation Age of Onset  . Cancer Maternal Grandmother 58    ovarian   No Known Allergies Current Outpatient Prescriptions on File Prior to Visit  Medication Sig Dispense Refill  . levothyroxine (SYNTHROID, LEVOTHROID) 25 MCG tablet TAKE 1 TABLET BY MOUTH EVERY DAY BEFORE BREAKFAST 30 tablet 11  . valACYclovir (VALTREX) 500 MG tablet Take 500 mg by mouth as needed.     No current facility-administered medications on file prior to visit.   The PMH, PSH, Social History, Family History, Medications, and allergies have been reviewed in [facility], and have been updated if relevant.   Review of Systems   Constitutional: Negative.   HENT: Negative.   Eyes: Negative.   Respiratory: Negative.   Cardiovascular: Negative.   Gastrointestinal: Negative.   Endocrine: Negative.   Genitourinary: Negative.   Musculoskeletal: Negative.   Skin: Negative.   Allergic/Immunologic: Negative.   Neurological: Negative.   Hematological: Negative.   Psychiatric/Behavioral: Negative.   All other systems reviewed and are negative.    The PMH, PSH, Social History, Family History, Medications, and allergies have been reviewed in [facility], and have been updated if relevant.  Physical exam: BP 112/62 mmHg  Pulse 82  Temp(Src) 98.1 F (36.7 C) (Oral)  Wt 143 lb 12 oz (65.205 kg)  SpO2 97%  LMP 06/06/2015  Wt Readings from Last 3 Encounters:  06/25/15 143 lb 12 oz (65.205 kg)  06/18/14 142 lb 12 oz (64.751 kg)  06/05/13 141 lb 4 oz (64.071 kg)    General:  Well-developed,well-nourished,in no acute distress; alert,appropriate and cooperative throughout examination Head:  normocephalic and atraumatic.   Eyes:  vision grossly intact, pupils equal, pupils round, and pupils reactive to light.   Ears:  R ear normal and L ear normal.   Nose:  no external deformity.   Mouth:  good dentition.   Neck:  No deformities, masses, or tenderness noted. Breasts:  No mass, nodules, thickening, tenderness, bulging, retraction, inflamation, nipple discharge or skin changes noted.   Lungs:  Normal respiratory effort, chest  expands symmetrically. Lungs are clear to auscultation, no crackles or wheezes. Heart:  Normal rate and regular rhythm. S1 and S2 normal without gallop, murmur, click, rub or other extra sounds. Abdomen:  Bowel sounds positive,abdomen soft and non-tender without masses, organomegaly or hernias noted. Rectal:  no external abnormalities.   Genitalia:  Pelvic Exam:        External: normal female genitalia without lesions or masses        Vagina: normal without lesions or masses        Cervix: normal  without lesions or masses        Adnexa: normal bimanual exam without masses or fullness        Uterus: normal by palpation        Pap smear: performed Msk:  No deformity or scoliosis noted of thoracic or lumbar spine.   Extremities:  No clubbing, cyanosis, edema, or deformity noted with normal full range of motion of all joints.   Neurologic:  alert & oriented X3 and gait normal.   Skin:  Intact without suspicious lesions or rashes Cervical Nodes:  No lymphadenopathy noted Axillary Nodes:  No palpable lymphadenopathy Psych:  Cognition and judgment appear intact. Alert and cooperative with normal attention span and concentration. No apparent delusions, illusions, hallucinations

## 2015-06-25 NOTE — Patient Instructions (Signed)
Great to see you. We will call you with your lab results and you can view them online.  

## 2015-06-25 NOTE — Addendum Note (Signed)
Addended by: Modena Nunnery on: 06/25/2015 09:48 AM   Modules accepted: Orders

## 2015-06-25 NOTE — Addendum Note (Signed)
Addended by: Modena Nunnery on: 06/25/2015 09:41 AM   Modules accepted: Orders

## 2015-06-26 ENCOUNTER — Encounter: Payer: Self-pay | Admitting: Family Medicine

## 2015-06-26 ENCOUNTER — Encounter: Payer: Self-pay | Admitting: *Deleted

## 2015-06-26 ENCOUNTER — Other Ambulatory Visit: Payer: Self-pay | Admitting: Family Medicine

## 2015-06-26 DIAGNOSIS — E039 Hypothyroidism, unspecified: Secondary | ICD-10-CM

## 2015-06-26 LAB — HIV ANTIBODY (ROUTINE TESTING W REFLEX): HIV 1&2 Ab, 4th Generation: NONREACTIVE

## 2015-06-27 ENCOUNTER — Encounter: Payer: Self-pay | Admitting: *Deleted

## 2015-06-27 LAB — CYTOLOGY - PAP

## 2015-07-29 ENCOUNTER — Ambulatory Visit
Admission: RE | Admit: 2015-07-29 | Discharge: 2015-07-29 | Disposition: A | Discharge status: Home / Self Care | Payer: BC Managed Care – PPO | Source: Ambulatory Visit | Attending: Family Medicine | Admitting: Family Medicine

## 2015-07-29 DIAGNOSIS — Z1231 Encounter for screening mammogram for malignant neoplasm of breast: Secondary | ICD-10-CM | POA: Diagnosis present

## 2015-08-12 ENCOUNTER — Other Ambulatory Visit (INDEPENDENT_AMBULATORY_CARE_PROVIDER_SITE_OTHER): Payer: BC Managed Care – PPO

## 2015-08-12 DIAGNOSIS — E039 Hypothyroidism, unspecified: Secondary | ICD-10-CM | POA: Diagnosis not present

## 2015-08-12 LAB — TSH: TSH: 5.23 u[IU]/mL — ABNORMAL HIGH (ref 0.35–4.50)

## 2015-08-12 LAB — T4, FREE: Free T4: 0.76 ng/dL (ref 0.60–1.60)

## 2015-08-13 ENCOUNTER — Other Ambulatory Visit: Payer: Self-pay | Admitting: Family Medicine

## 2015-08-13 ENCOUNTER — Encounter: Payer: Self-pay | Admitting: Family Medicine

## 2015-08-13 MED ORDER — LEVOTHYROXINE SODIUM 50 MCG PO TABS: 50.0000 ug | ORAL_TABLET | Freq: Every day | ORAL | Status: DC

## 2015-12-02 ENCOUNTER — Other Ambulatory Visit: Payer: Self-pay | Admitting: Family Medicine

## 2015-12-02 DIAGNOSIS — E039 Hypothyroidism, unspecified: Secondary | ICD-10-CM

## 2015-12-02 DIAGNOSIS — Z01419 Encounter for gynecological examination (general) (routine) without abnormal findings: Secondary | ICD-10-CM

## 2015-12-03 ENCOUNTER — Other Ambulatory Visit (INDEPENDENT_AMBULATORY_CARE_PROVIDER_SITE_OTHER): Payer: BC Managed Care – PPO

## 2015-12-03 DIAGNOSIS — E039 Hypothyroidism, unspecified: Secondary | ICD-10-CM

## 2015-12-03 DIAGNOSIS — Z01419 Encounter for gynecological examination (general) (routine) without abnormal findings: Secondary | ICD-10-CM | POA: Diagnosis not present

## 2015-12-03 LAB — CBC WITH DIFFERENTIAL/PLATELET
BASOS ABS: 0.1 10*3/uL (ref 0.0–0.1)
Basophils Relative: 0.9 % (ref 0.0–3.0)
Eosinophils Absolute: 0.2 10*3/uL (ref 0.0–0.7)
Eosinophils Relative: 3 % (ref 0.0–5.0)
HCT: 38.6 % (ref 36.0–46.0)
Hemoglobin: 12.9 g/dL (ref 12.0–15.0)
LYMPHS ABS: 1.8 10*3/uL (ref 0.7–4.0)
Lymphocytes Relative: 31.8 % (ref 12.0–46.0)
MCHC: 33.5 g/dL (ref 30.0–36.0)
MCV: 96.9 fl (ref 78.0–100.0)
MONO ABS: 0.4 10*3/uL (ref 0.1–1.0)
Monocytes Relative: 7.6 % (ref 3.0–12.0)
NEUTROS ABS: 3.2 10*3/uL (ref 1.4–7.7)
NEUTROS PCT: 56.7 % (ref 43.0–77.0)
Platelets: 302 10*3/uL (ref 150.0–400.0)
RBC: 3.98 Mil/uL (ref 3.87–5.11)
RDW: 13 % (ref 11.5–15.5)
WBC: 5.7 10*3/uL (ref 4.0–10.5)

## 2015-12-03 LAB — LIPID PANEL
CHOLESTEROL: 195 mg/dL (ref 0–200)
HDL: 54.6 mg/dL (ref >39.00)
LDL CALC: 115 mg/dL — AB (ref 0–99)
NonHDL: 140.43
Total CHOL/HDL Ratio: 4
Triglycerides: 127 mg/dL (ref 0.0–149.0)
VLDL: 25.4 mg/dL (ref 0.0–40.0)

## 2015-12-03 LAB — COMPREHENSIVE METABOLIC PANEL
ALT: 11 U/L (ref 0–35)
AST: 15 U/L (ref 0–37)
Albumin: 4.4 g/dL (ref 3.5–5.2)
Alkaline Phosphatase: 39 U/L (ref 39–117)
BUN: 13 mg/dL (ref 6–23)
CHLORIDE: 103 meq/L (ref 96–112)
CO2: 29 mEq/L (ref 19–32)
Calcium: 9.8 mg/dL (ref 8.4–10.5)
Creatinine, Ser: 0.76 mg/dL (ref 0.40–1.20)
GFR: 87.39 mL/min (ref >60.00)
GLUCOSE: 84 mg/dL (ref 70–99)
POTASSIUM: 4.3 meq/L (ref 3.5–5.1)
SODIUM: 137 meq/L (ref 135–145)
Total Bilirubin: 0.5 mg/dL (ref 0.2–1.2)
Total Protein: 7.9 g/dL (ref 6.0–8.3)

## 2015-12-03 LAB — T4, FREE: FREE T4: 0.83 ng/dL (ref 0.60–1.60)

## 2015-12-03 LAB — TSH: TSH: 5.08 u[IU]/mL — AB (ref 0.35–4.50)

## 2015-12-04 ENCOUNTER — Encounter: Payer: Self-pay | Admitting: Family Medicine

## 2015-12-12 ENCOUNTER — Telehealth: Payer: Self-pay | Admitting: Family Medicine

## 2015-12-12 ENCOUNTER — Encounter: Payer: Self-pay | Admitting: Family Medicine

## 2015-12-12 NOTE — Telephone Encounter (Signed)
Patient called about her e-mail request to discuss labs.  Please call patient back today.

## 2015-12-12 NOTE — Telephone Encounter (Signed)
Returned pt's call and answered her questions.  

## 2016-07-16 ENCOUNTER — Other Ambulatory Visit: Payer: Self-pay | Admitting: Family Medicine

## 2016-07-16 DIAGNOSIS — Z1231 Encounter for screening mammogram for malignant neoplasm of breast: Secondary | ICD-10-CM

## 2016-07-20 ENCOUNTER — Ambulatory Visit (INDEPENDENT_AMBULATORY_CARE_PROVIDER_SITE_OTHER): Payer: BC Managed Care – PPO | Admitting: Family Medicine

## 2016-07-20 ENCOUNTER — Other Ambulatory Visit (HOSPITAL_COMMUNITY)
Admission: RE | Admit: 2016-07-20 | Discharge: 2016-07-20 | Disposition: A | Discharge status: Home / Self Care | Payer: BC Managed Care – PPO | Source: Ambulatory Visit | Attending: Family Medicine | Admitting: Family Medicine

## 2016-07-20 ENCOUNTER — Encounter: Payer: Self-pay | Admitting: Family Medicine

## 2016-07-20 VITALS — BP 102/72 | HR 71 | Temp 97.3°F | Ht 66.25 in | Wt 145.2 lb

## 2016-07-20 DIAGNOSIS — E039 Hypothyroidism, unspecified: Secondary | ICD-10-CM

## 2016-07-20 DIAGNOSIS — Z1151 Encounter for screening for human papillomavirus (HPV): Secondary | ICD-10-CM | POA: Diagnosis present

## 2016-07-20 DIAGNOSIS — Z23 Encounter for immunization: Secondary | ICD-10-CM

## 2016-07-20 DIAGNOSIS — Z01419 Encounter for gynecological examination (general) (routine) without abnormal findings: Secondary | ICD-10-CM | POA: Diagnosis present

## 2016-07-20 DIAGNOSIS — Z0001 Encounter for general adult medical examination with abnormal findings: Secondary | ICD-10-CM

## 2016-07-20 DIAGNOSIS — F411 Generalized anxiety disorder: Secondary | ICD-10-CM | POA: Insufficient documentation

## 2016-07-20 LAB — COMPREHENSIVE METABOLIC PANEL
ALK PHOS: 38 U/L — AB (ref 39–117)
ALT: 13 U/L (ref 0–35)
AST: 18 U/L (ref 0–37)
Albumin: 4.4 g/dL (ref 3.5–5.2)
BILIRUBIN TOTAL: 0.7 mg/dL (ref 0.2–1.2)
BUN: 11 mg/dL (ref 6–23)
CALCIUM: 9.6 mg/dL (ref 8.4–10.5)
CO2: 27 mEq/L (ref 19–32)
Chloride: 103 mEq/L (ref 96–112)
Creatinine, Ser: 0.79 mg/dL (ref 0.40–1.20)
GFR: 83.34 mL/min (ref >60.00)
GLUCOSE: 91 mg/dL (ref 70–99)
POTASSIUM: 4.4 meq/L (ref 3.5–5.1)
Sodium: 137 mEq/L (ref 135–145)
Total Protein: 7.8 g/dL (ref 6.0–8.3)

## 2016-07-20 LAB — CBC WITH DIFFERENTIAL/PLATELET
BASOS ABS: 0 10*3/uL (ref 0.0–0.1)
Basophils Relative: 0.6 % (ref 0.0–3.0)
EOS PCT: 2.2 % (ref 0.0–5.0)
Eosinophils Absolute: 0.1 10*3/uL (ref 0.0–0.7)
HEMATOCRIT: 39 % (ref 36.0–46.0)
HEMOGLOBIN: 13.2 g/dL (ref 12.0–15.0)
LYMPHS ABS: 1.3 10*3/uL (ref 0.7–4.0)
LYMPHS PCT: 26.8 % (ref 12.0–46.0)
MCHC: 34 g/dL (ref 30.0–36.0)
MCV: 96.9 fl (ref 78.0–100.0)
MONOS PCT: 9.1 % (ref 3.0–12.0)
Monocytes Absolute: 0.4 10*3/uL (ref 0.1–1.0)
NEUTROS PCT: 61.3 % (ref 43.0–77.0)
Neutro Abs: 2.9 10*3/uL (ref 1.4–7.7)
Platelets: 269 10*3/uL (ref 150.0–400.0)
RBC: 4.03 Mil/uL (ref 3.87–5.11)
RDW: 13.3 % (ref 11.5–15.5)
WBC: 4.7 10*3/uL (ref 4.0–10.5)

## 2016-07-20 LAB — LIPID PANEL
CHOL/HDL RATIO: 3
Cholesterol: 188 mg/dL (ref 0–200)
HDL: 57.9 mg/dL (ref >39.00)
LDL CALC: 110 mg/dL — AB (ref 0–99)
NONHDL: 130.39
Triglycerides: 102 mg/dL (ref 0.0–149.0)
VLDL: 20.4 mg/dL (ref 0.0–40.0)

## 2016-07-20 LAB — T4, FREE: Free T4: 0.86 ng/dL (ref 0.60–1.60)

## 2016-07-20 LAB — TSH: TSH: 4.7 u[IU]/mL — AB (ref 0.35–4.50)

## 2016-07-20 MED ORDER — ALPRAZOLAM 0.25 MG PO TABS: 0.2500 mg | ORAL_TABLET | Freq: Two times a day (BID) | ORAL | 0 refills | Status: AC | PRN

## 2016-07-20 NOTE — Assessment & Plan Note (Signed)
Discussed tx options. She feels she does not need daily rx or psychotherapy. rx printed for xanax to use sparingly- discussed addiction and sedation potential.

## 2016-07-20 NOTE — Progress Notes (Signed)
Pre visit review using our clinic review tool, if applicable. No additional management support is needed unless otherwise documented below in the visit note. 

## 2016-07-20 NOTE — Assessment & Plan Note (Signed)
Reviewed preventive care protocols, scheduled due services, and updated immunizations Discussed nutrition, exercise, diet, and healthy lifestyle.  Discussed USPSTF recommendations of cervical cancer screening.  She is aware that interval of 3 years is recommended but pt would prefer to have pap smear done today.  

## 2016-07-20 NOTE — Progress Notes (Signed)
45 yo pleasant female for CPX and follow up of chronic medical conditions.   G0.  No h/o abnormal pap smears. Sexually active with her partner only. Normal pap smear 06/25/15 (done by me). Negative mammogram 07/29/15. Has one scheduled 2 weeks for now.  Grandmother had ovarian CA in her 58s, otherwise no other cancer runs in her family that she is aware of.  Has dermatologist that she sees every 6 months to 1 year.  Known history of anxiety.  She feels she has maybe one or two panic attacks a month.  Denies feeling depressed.  No SI or HI.  Sleeping well. Has tried xanax in past.  Hypothyroidism- currently taking synthroid 50 mcg daily. Denies any symptoms of hyper or hypothyroidism. Due for labs Lab Results  Component Value Date   TSH 5.08 (H) 12/03/2015    Lab Results  Component Value Date   CHOL 195 12/03/2015   HDL 54.60 12/03/2015   LDLCALC 115 (H) 12/03/2015   TRIG 127.0 12/03/2015   CHOLHDL 4 12/03/2015    Lab Results  Component Value Date   CREATININE 0.76 12/03/2015    Lab Results  Component Value Date   WBC 5.7 12/03/2015   HGB 12.9 12/03/2015   HCT 38.6 12/03/2015   MCV 96.9 12/03/2015   PLT 302.0 12/03/2015    Patient Active Problem List  Diagnosis  . Well woman exam with routine gynecological exam  . Hypothyroidism   No past medical history on file. No past surgical history on file. Social History  Substance Use Topics  . Smoking status: Former Research scientist (life sciences)  . Smokeless tobacco: Not on file     Comment: Quit 12/12/11  . Alcohol use Not on file   Family History  Problem Relation Age of Onset  . Cancer Maternal Grandmother 52    ovarian   No Known Allergies Current Outpatient Prescriptions on File Prior to Visit  Medication Sig Dispense Refill  . levothyroxine (SYNTHROID, LEVOTHROID) 50 MCG tablet Take 1 tablet (50 mcg total) by mouth daily. 90 tablet 3  . valACYclovir (VALTREX) 500 MG tablet Take 500 mg by mouth as needed.     No current  facility-administered medications on file prior to visit.    The PMH, PSH, Social History, Family History, Medications, and allergies have been reviewed in [facility], and have been updated if relevant.   Review of Systems  Constitutional: Negative.   HENT: Negative.   Eyes: Negative.   Respiratory: Negative.   Cardiovascular: Negative.   Gastrointestinal: Negative.   Endocrine: Negative.   Genitourinary: Negative.   Musculoskeletal: Negative.   Skin: Negative.   Allergic/Immunologic: Negative.   Neurological: Negative.   Hematological: Negative.   Psychiatric/Behavioral: Negative for decreased concentration, dysphoric mood, hallucinations, sleep disturbance and suicidal ideas. The patient is nervous/anxious. The patient is not hyperactive.   All other systems reviewed and are negative.    The PMH, PSH, Social History, Family History, Medications, and allergies have been reviewed in [facility], and have been updated if relevant.  Physical exam: There were no vitals taken for this visit.  Wt Readings from Last 3 Encounters:  06/25/15 143 lb 12 oz (65.2 kg)  06/18/14 142 lb 12 oz (64.8 kg)  06/05/13 141 lb 4 oz (64.1 kg)     General:  Well-developed,well-nourished,in no acute distress; alert,appropriate and cooperative throughout examination Head:  normocephalic and atraumatic.   Eyes:  vision grossly intact, PERRL Ears:  R ear normal and L ear normal externally, TMs clear bilaterally  Nose:  no external deformity.   Mouth:  good dentition.   Neck:  No deformities, masses, or tenderness noted. Breasts:  No mass, nodules, thickening, tenderness, bulging, retraction, inflamation, nipple discharge or skin changes noted.   Lungs:  Normal respiratory effort, chest expands symmetrically. Lungs are clear to auscultation, no crackles or wheezes. Heart:  Normal rate and regular rhythm. S1 and S2 normal without gallop, murmur, click, rub or other extra sounds. Abdomen:  Bowel sounds  positive,abdomen soft and non-tender without masses, organomegaly or hernias noted. Rectal:  no external abnormalities.   Genitalia:  Pelvic Exam:        External: normal female genitalia without lesions or masses        Vagina: normal without lesions or masses        Cervix: normal without lesions or masses        Adnexa: normal bimanual exam without masses or fullness        Uterus: normal by palpation        Pap smear: performed Msk:  No deformity or scoliosis noted of thoracic or lumbar spine.   Extremities:  No clubbing, cyanosis, edema, or deformity noted with normal full range of motion of all joints.   Neurologic:  alert & oriented X3 and gait normal.   Skin:  Intact without suspicious lesions or rashes Cervical Nodes:  No lymphadenopathy noted Axillary Nodes:  No palpable lymphadenopathy Psych:  Cognition and judgment appear intact. Alert and cooperative with normal attention span and concentration. No apparent delusions, illusions, hallucinations

## 2016-07-20 NOTE — Patient Instructions (Signed)
Great to see you! Happy Holidays! We will call you with your results and you can view them online.

## 2016-07-20 NOTE — Assessment & Plan Note (Signed)
Continue current dose of synthroid, clinically euthyroid. Check labs today.

## 2016-07-22 ENCOUNTER — Encounter: Payer: Self-pay | Admitting: Family Medicine

## 2016-07-22 ENCOUNTER — Encounter: Payer: Self-pay | Admitting: *Deleted

## 2016-07-22 LAB — CYTOLOGY - PAP
Diagnosis: NEGATIVE
HPV (WINDOPATH): NOT DETECTED

## 2016-08-05 ENCOUNTER — Other Ambulatory Visit: Payer: Self-pay | Admitting: Family Medicine

## 2016-08-05 ENCOUNTER — Ambulatory Visit
Admission: RE | Admit: 2016-08-05 | Discharge: 2016-08-05 | Disposition: A | Discharge status: Home / Self Care | Payer: BC Managed Care – PPO | Source: Ambulatory Visit | Attending: Family Medicine | Admitting: Family Medicine

## 2016-08-05 DIAGNOSIS — Z1231 Encounter for screening mammogram for malignant neoplasm of breast: Secondary | ICD-10-CM | POA: Diagnosis present

## 2016-08-05 DIAGNOSIS — R928 Other abnormal and inconclusive findings on diagnostic imaging of breast: Secondary | ICD-10-CM | POA: Diagnosis not present

## 2016-08-06 ENCOUNTER — Other Ambulatory Visit: Payer: Self-pay | Admitting: Family Medicine

## 2016-08-06 ENCOUNTER — Encounter: Payer: Self-pay | Admitting: Family Medicine

## 2016-08-06 DIAGNOSIS — R928 Other abnormal and inconclusive findings on diagnostic imaging of breast: Secondary | ICD-10-CM

## 2016-08-06 DIAGNOSIS — N6489 Other specified disorders of breast: Secondary | ICD-10-CM

## 2016-08-07 ENCOUNTER — Other Ambulatory Visit: Payer: Self-pay | Admitting: Family Medicine

## 2016-08-12 ENCOUNTER — Encounter: Payer: Self-pay | Admitting: Family Medicine

## 2016-08-19 ENCOUNTER — Ambulatory Visit
Admission: RE | Admit: 2016-08-19 | Discharge: 2016-08-19 | Disposition: A | Discharge status: Home / Self Care | Payer: BC Managed Care – PPO | Source: Ambulatory Visit | Attending: Family Medicine | Admitting: Family Medicine

## 2016-08-19 DIAGNOSIS — R928 Other abnormal and inconclusive findings on diagnostic imaging of breast: Secondary | ICD-10-CM | POA: Diagnosis present

## 2016-08-19 DIAGNOSIS — N6320 Unspecified lump in the left breast, unspecified quadrant: Secondary | ICD-10-CM | POA: Insufficient documentation

## 2016-08-19 DIAGNOSIS — N6489 Other specified disorders of breast: Secondary | ICD-10-CM

## 2016-09-15 IMAGING — US THYROID ULTRASOUND
1 series · 14 of 25 positions shown · non-contrast
Comparison: None.

CLINICAL DATA: Elevated TSH, possible thyroid dysfunction

EXAM:
THYROID ULTRASOUND
TECHNIQUE: Ultrasound examination of the thyroid gland and adjacent soft
tissues was performed.

[Series 1: thyroid ultrasound · 0.08mm/px · 14 of 37 slices shown]
[im 1/37]
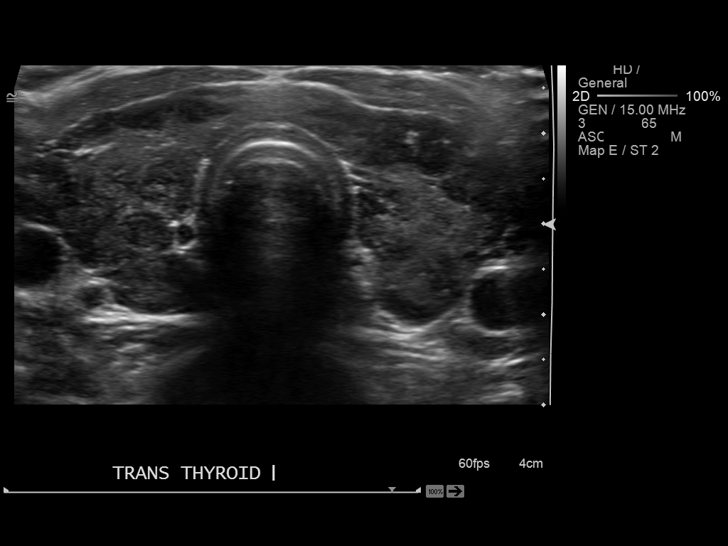
[im 4/37]
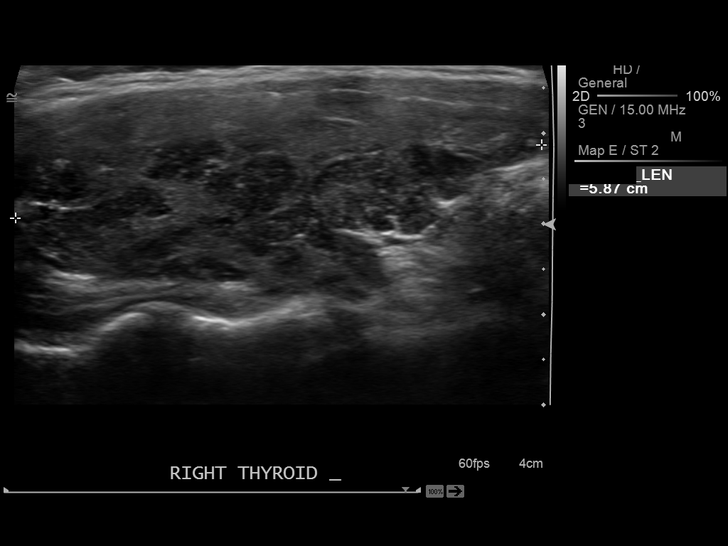
[im 7/37]
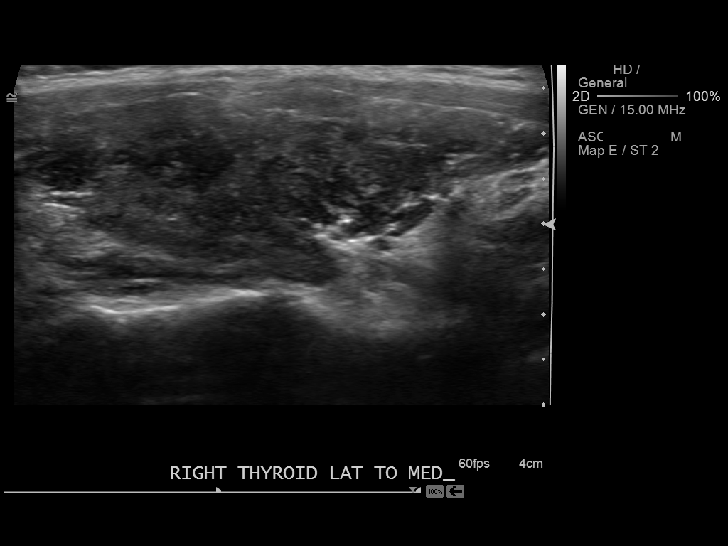
[im 10/37]
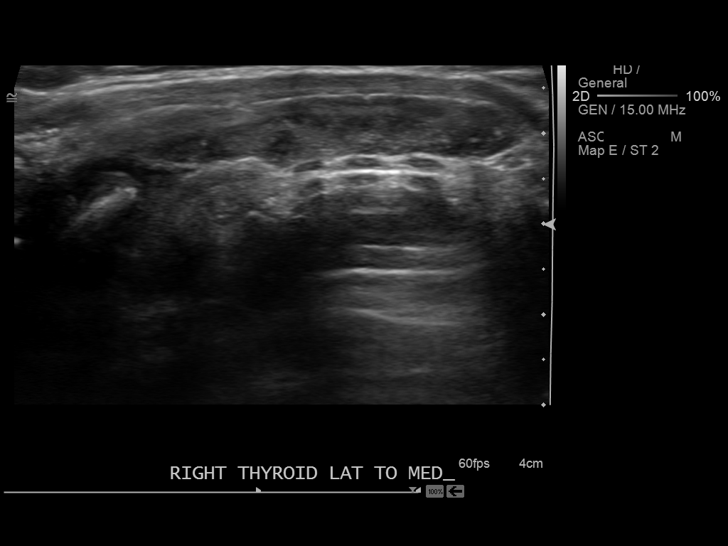
[im 13/37]
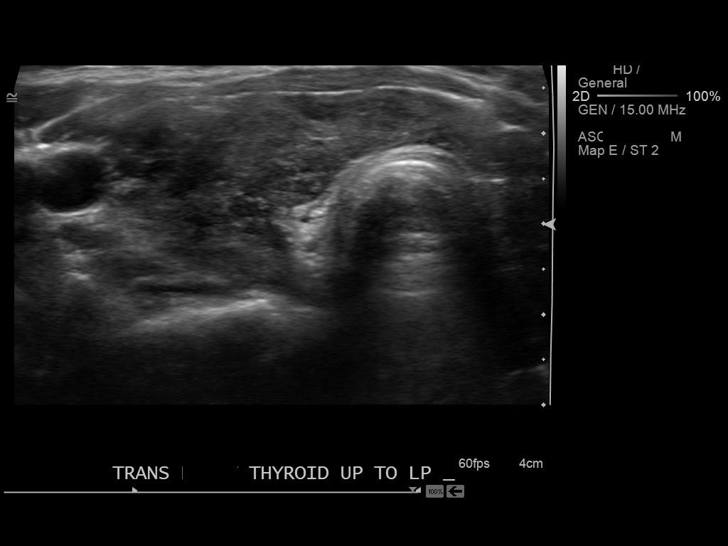
[im 14/37]
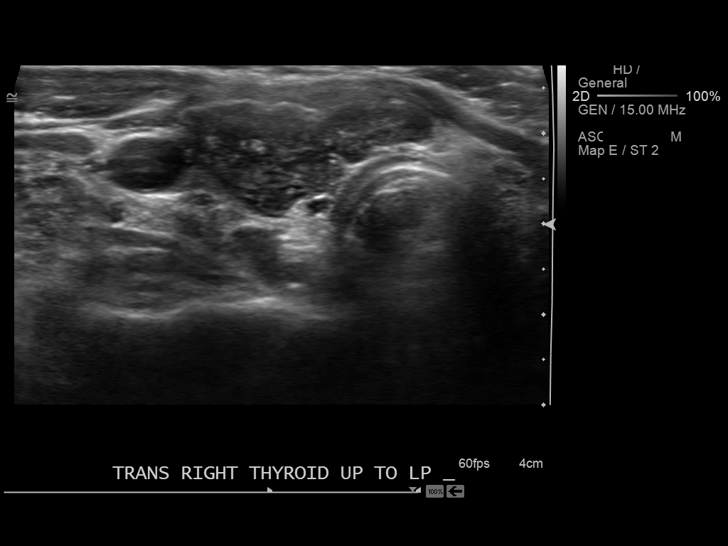
[im 17/37]
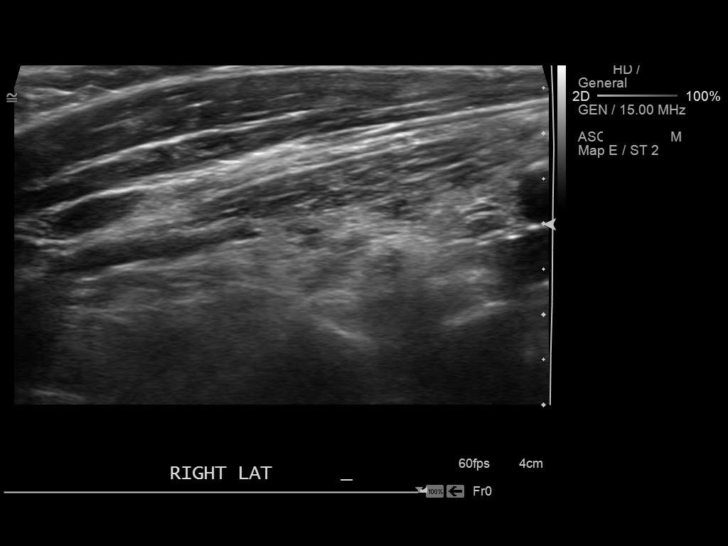
[im 20/37]
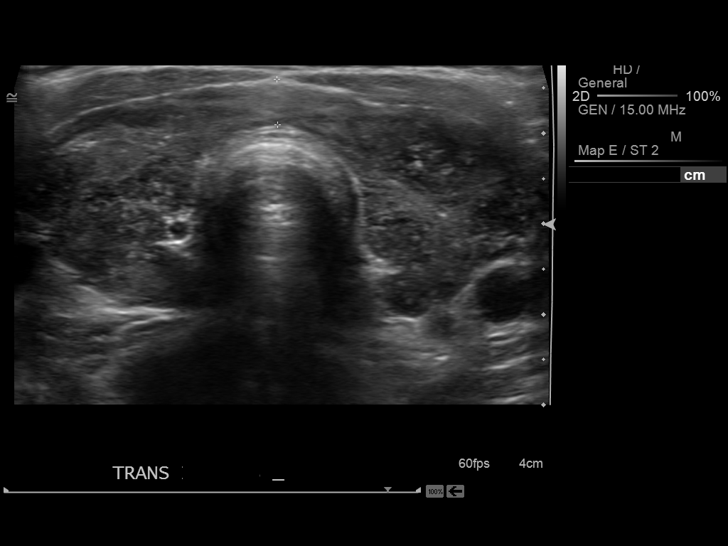
[im 23/37]
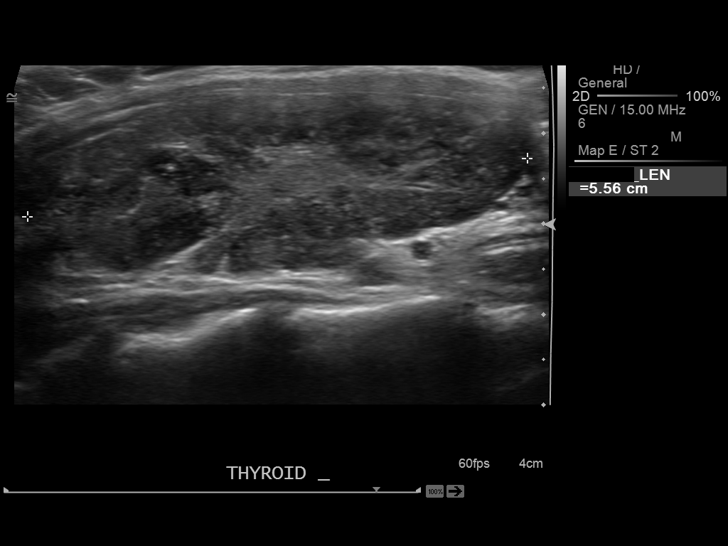
[im 25/37]
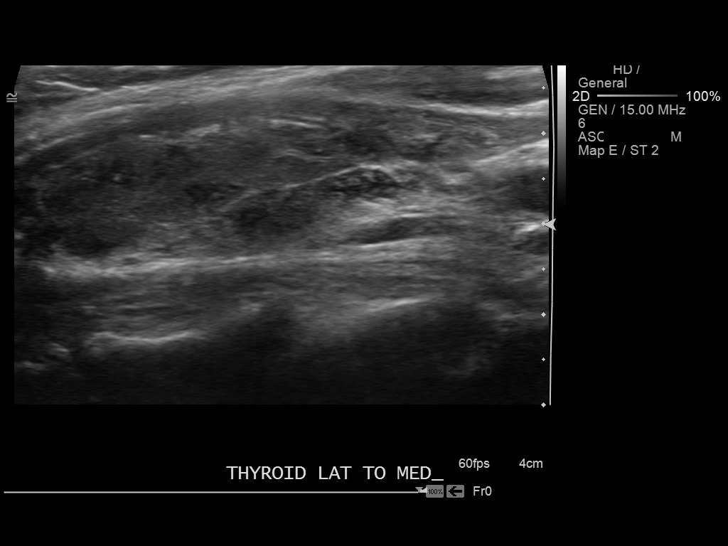
[im 28/37]
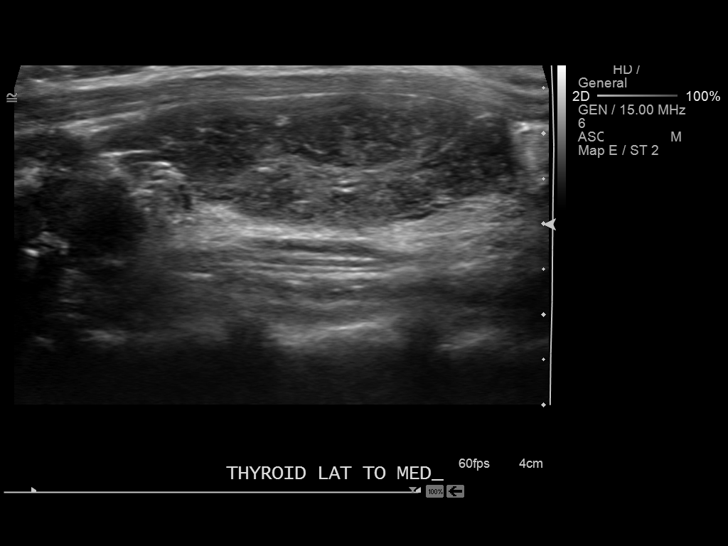
[im 31/37]
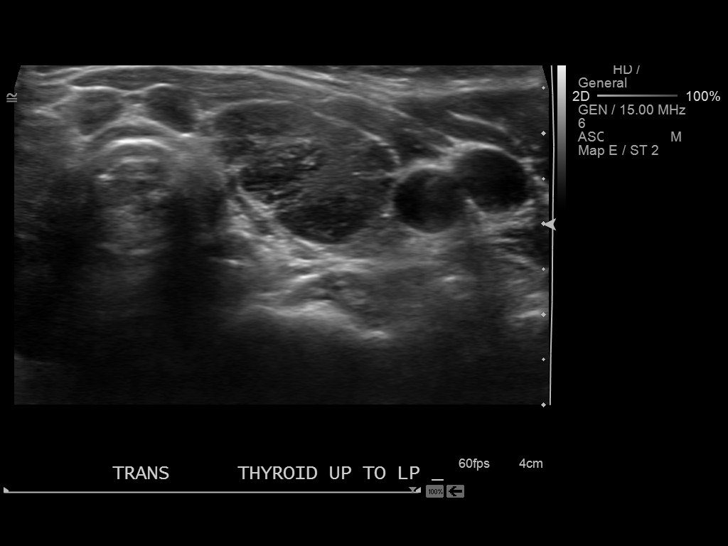
[im 34/37]
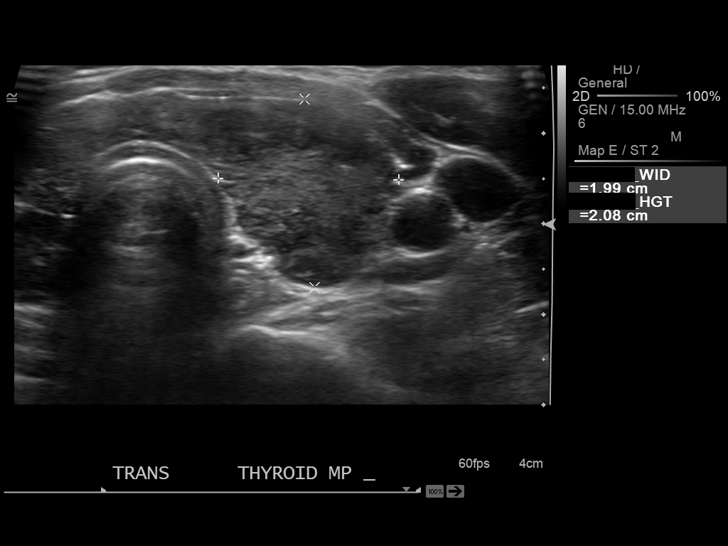
[im 37/37]
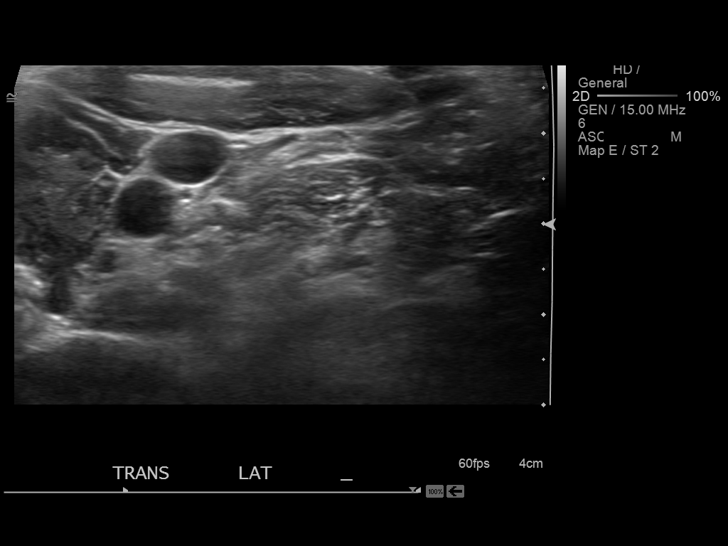

[14 of 25 positions shown; findings below may reference images not displayed]

FINDINGS: Right thyroid lobe

Measurements: 5.9 x 2.3 x 2.4 cm. Diffusely heterogeneous
ill-defined and hypoechoic nodular pattern. Normal vascularity. No
discrete nodule or mass.

Left thyroid lobe

Measurements: 5.6 x 2.1 x 2.0 cm. Symmetric diffuse heterogeneous
ill-defined and hypoechoic nodular pattern. No discrete abnormality.
Symmetric vascularity.

Isthmus

Thickness: 5 mm.  No nodules visualized.

Lymphadenopathy

None visualized.
IMPRESSION: Diffusely heterogeneous thyroid with a hypoechoic nodular pattern
throughout and normal vascularity. Appearance remains nonspecific.

No discrete thyroid nodule or mass to warrant biopsy.

## 2017-01-01 ENCOUNTER — Encounter: Payer: Self-pay | Admitting: Family Medicine

## 2017-01-17 ENCOUNTER — Telehealth: Payer: Self-pay

## 2017-01-17 DIAGNOSIS — N6320 Unspecified lump in the left breast, unspecified quadrant: Secondary | ICD-10-CM | POA: Insufficient documentation

## 2017-01-17 NOTE — Telephone Encounter (Signed)
Pt was notified.  

## 2017-01-17 NOTE — Telephone Encounter (Signed)
Pt left v/m requesting order for mammogram sent to [facility] for 6 month f/u appt. Per 08/05/16 report in pts chart recommend 6 month f/u diagnostic lt breast mammogram and [place]. Pt request cb when order placed so she can schedule appt.

## 2017-01-17 NOTE — Telephone Encounter (Signed)
Orders entered

## 2017-01-18 ENCOUNTER — Other Ambulatory Visit: Payer: Self-pay | Admitting: Family Medicine

## 2017-01-18 DIAGNOSIS — N6320 Unspecified lump in the left breast, unspecified quadrant: Secondary | ICD-10-CM

## 2017-02-22 ENCOUNTER — Ambulatory Visit
Admission: RE | Admit: 2017-02-22 | Discharge: 2017-02-22 | Disposition: A | Discharge status: Home / Self Care | Payer: BC Managed Care – PPO | Source: Ambulatory Visit | Attending: Family Medicine | Admitting: Family Medicine

## 2017-02-22 DIAGNOSIS — N6012 Diffuse cystic mastopathy of left breast: Secondary | ICD-10-CM | POA: Insufficient documentation

## 2017-02-22 DIAGNOSIS — N6320 Unspecified lump in the left breast, unspecified quadrant: Secondary | ICD-10-CM | POA: Diagnosis present

## 2017-07-28 ENCOUNTER — Other Ambulatory Visit: Payer: Self-pay | Admitting: Family Medicine

## 2017-09-28 ENCOUNTER — Other Ambulatory Visit: Payer: Self-pay | Admitting: Family Medicine

## 2017-09-28 ENCOUNTER — Ambulatory Visit
Admission: RE | Admit: 2017-09-28 | Discharge: 2017-09-28 | Disposition: A | Discharge status: Home / Self Care | Payer: BC Managed Care – PPO | Source: Ambulatory Visit | Attending: Family Medicine | Admitting: Family Medicine

## 2017-09-28 DIAGNOSIS — N6320 Unspecified lump in the left breast, unspecified quadrant: Secondary | ICD-10-CM

## 2017-10-31 ENCOUNTER — Other Ambulatory Visit: Payer: Self-pay | Admitting: Family Medicine

## 2017-12-02 ENCOUNTER — Other Ambulatory Visit: Payer: Self-pay | Admitting: Family Medicine

## 2017-12-02 NOTE — Telephone Encounter (Signed)
TA-This pt has not been seen since 11.2017/plz advise about refill/last one that I sent in I advised that needed appt to F/U/thx dmf

## 2017-12-02 NOTE — Telephone Encounter (Signed)
I don't want her without this rx so okay to refill but unfortunately this will be the last refill if she does not follow up.

## 2018-01-04 ENCOUNTER — Other Ambulatory Visit: Payer: Self-pay | Admitting: Family Medicine

## 2018-02-03 ENCOUNTER — Other Ambulatory Visit: Payer: Self-pay | Admitting: Family Medicine

## 2018-05-18 IMAGING — US US BREAST*L* LIMITED INC AXILLA
1 series · 8 of 8 positions shown · non-contrast
Comparison: 08/19/2016 mammogram and ultrasound and prior studies

CLINICAL DATA: 46-year-old female for follow-up of left breast
mass.

EXAM:
2D DIGITAL DIAGNOSTIC LEFT MAMMOGRAM WITH CAD AND ADJUNCT TOMO
ULTRASOUND LEFT BREAST

[Series 1: us breast*left* limited inc axilla · 0.05mm/px · 8 of 8 slices shown]
[im 1/8]
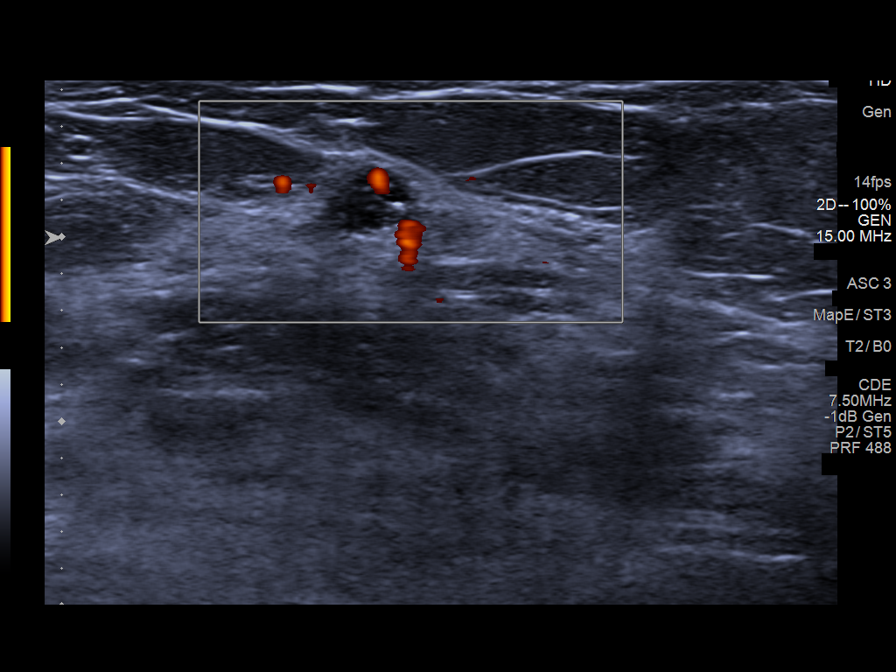
[im 2/8]
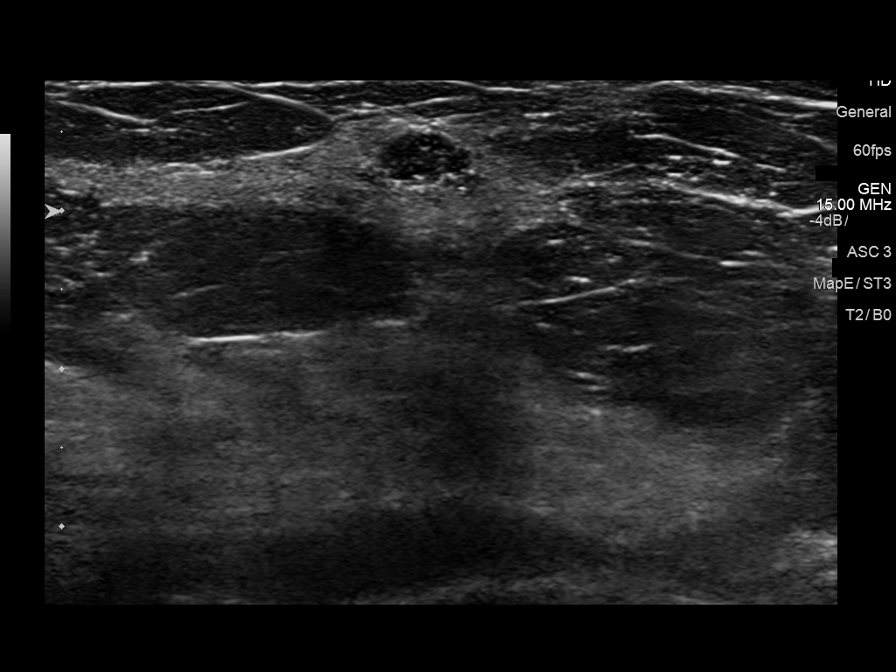
[im 3/8]
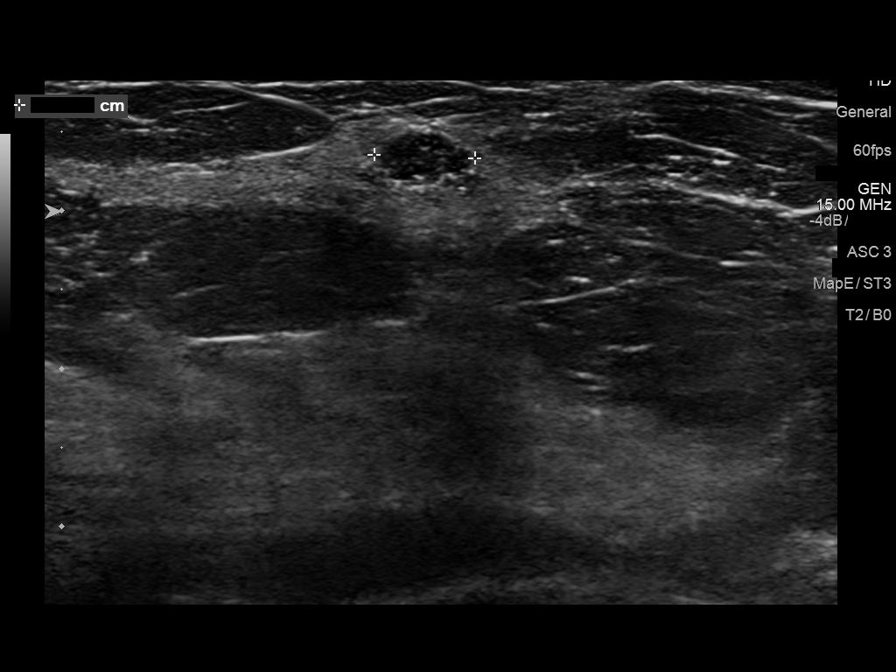
[im 4/8]
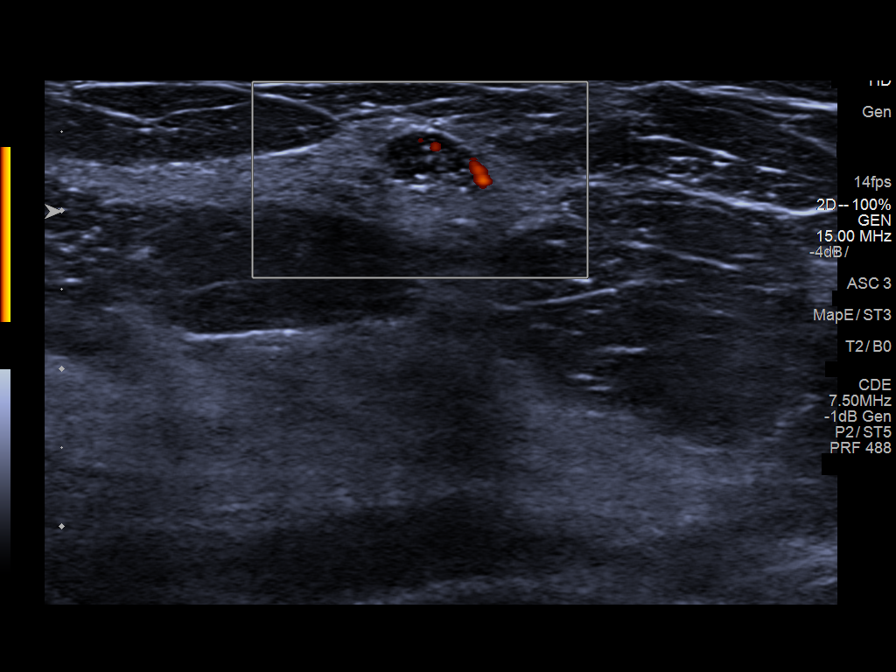
[im 5/8]
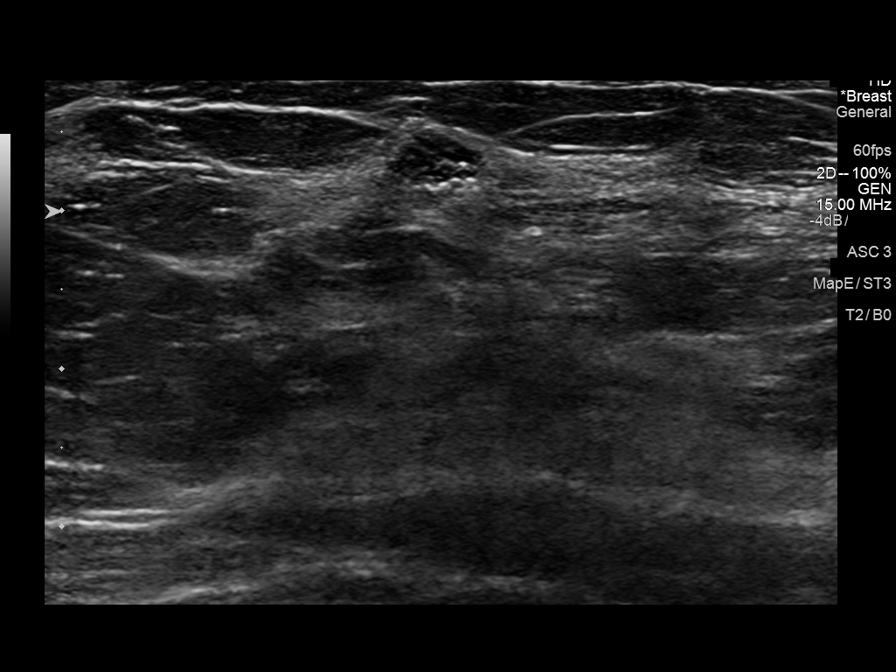
[im 6/8]
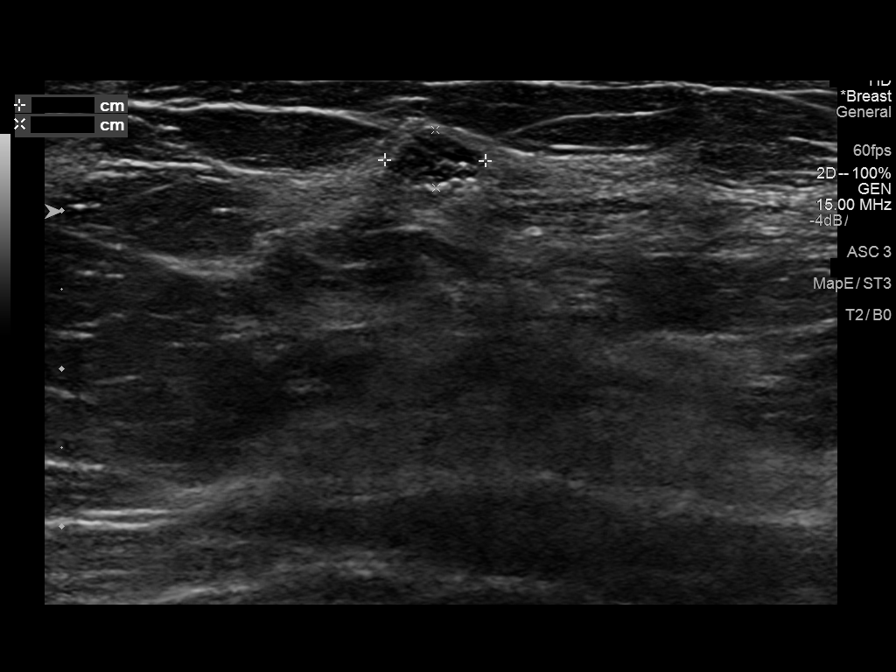
[im 7/8]
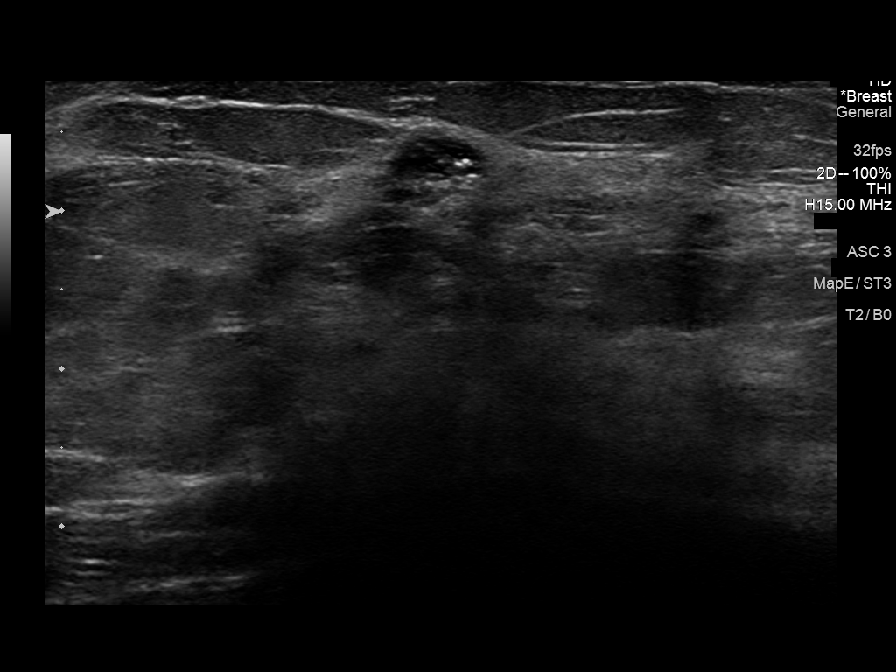
[im 8/8]
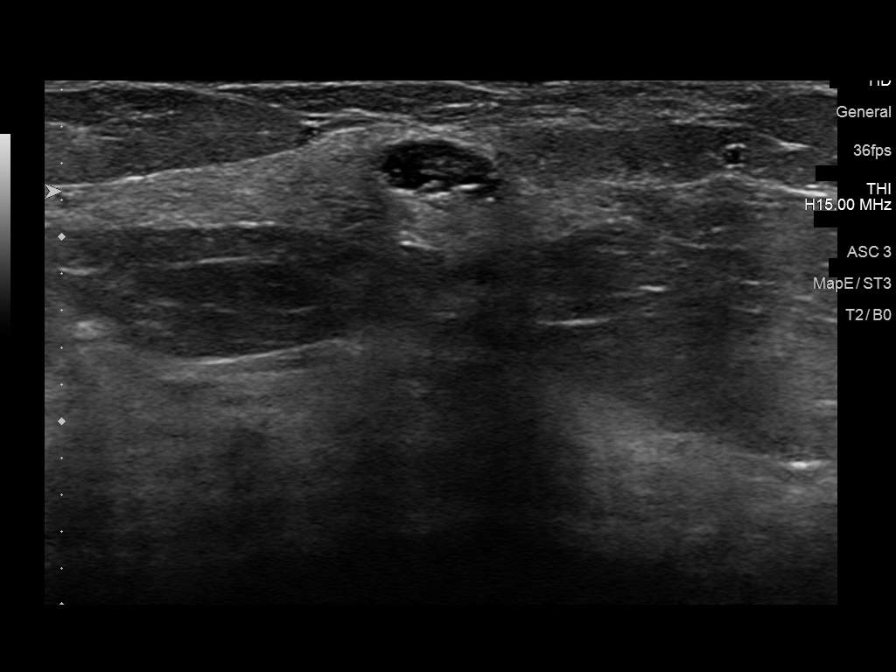

[8 of 8 positions shown; findings below may reference images not displayed]

ACR Breast Density Category b: There are scattered areas of
fibroglandular density.
FINDINGS: 2D and 3D full field and spot compression views of the left breast
demonstrate a stable faint circumscribed oval hypoechoic mass within
the upper-outer left breast.

No new mass, distortion or worrisome calcifications are noted.

Mammographic images were processed with CAD.

Targeted ultrasound is performed, showing a stable 6 x 4 x 6 mm
circumscribed mass with cystic component at the 2 o'clock position
of the left breast 4 cm from the nipple.
IMPRESSION: Stable likely benign fibrocystic changes within the upper-outer left
breast. Six-month followup is recommended to ensure 1 year
stability.

RECOMMENDATION:
Bilateral diagnostic mammograms and left breast ultrasound in 6
months to resume annual mammogram schedule and to reassess likely
benign left breast finding.

I have discussed the findings and recommendations with the patient.
Results were also provided in writing at the conclusion of the
visit. If applicable, a reminder letter will be sent to the patient
regarding the next appointment.

BI-RADS CATEGORY  3: Probably benign.

## 2018-12-22 IMAGING — US US BREAST*L* LIMITED INC AXILLA
1 series · 6 of 6 positions shown · non-contrast
Comparison: Previous exam(s).

CLINICAL DATA: Short-term interval follow-up of probable benign
findings in the left breast.

EXAM:
2D DIGITAL DIAGNOSTIC BILATERAL MAMMOGRAM WITH CAD AND ADJUNCT TOMO
ULTRASOUND LEFT BREAST

[Series 1: us breast*left* limited inc axilla · 0.06mm/px · 6 of 6 slices shown]
[im 1/6]
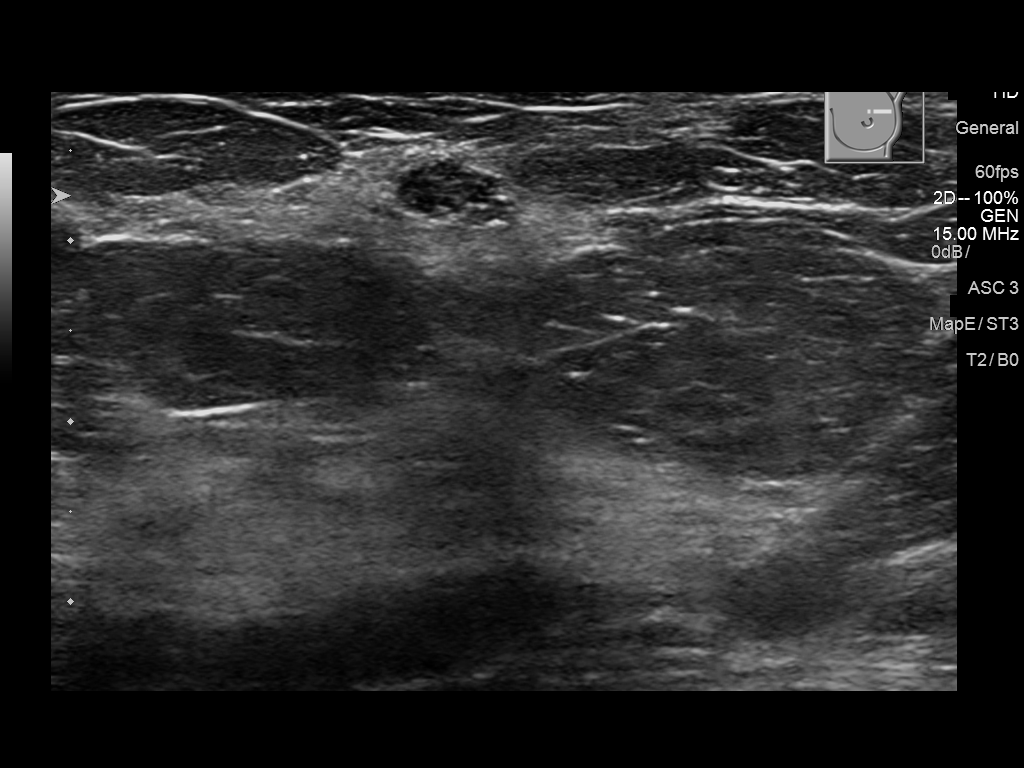
[im 2/6]
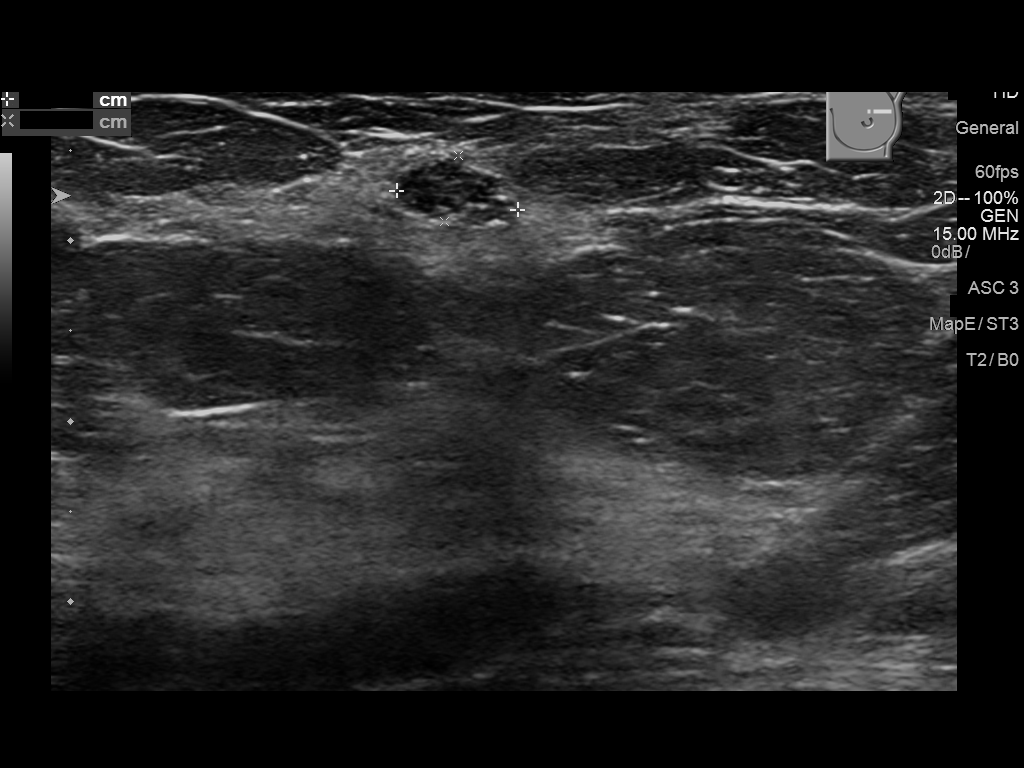
[im 3/6]
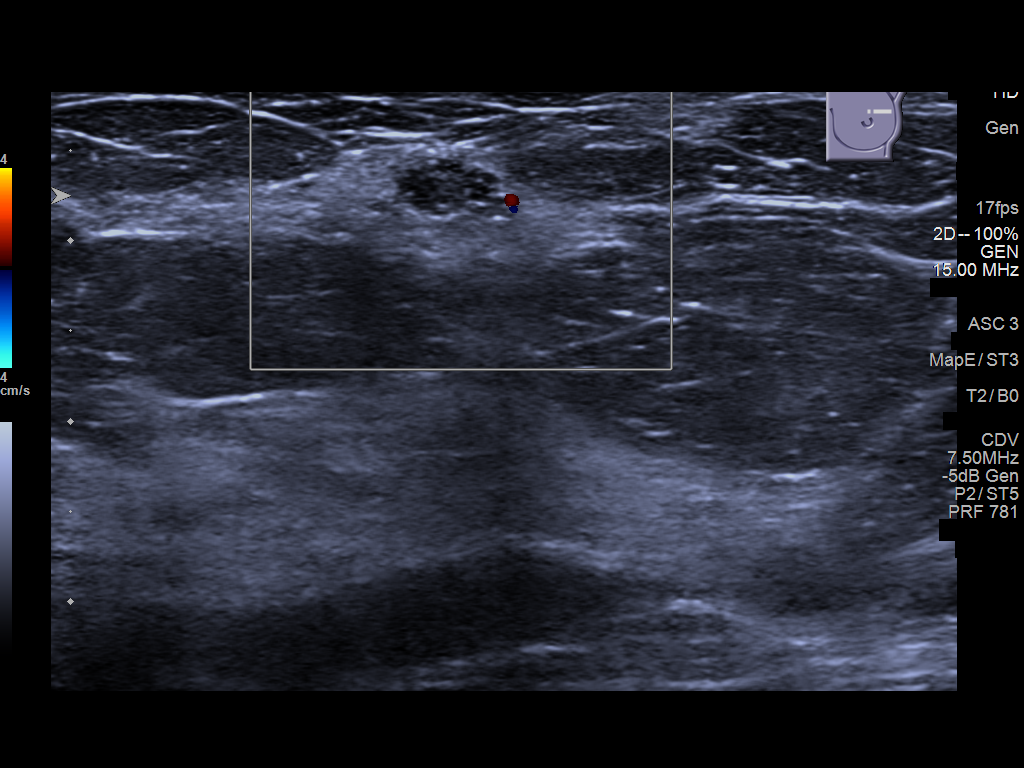
[im 4/6]
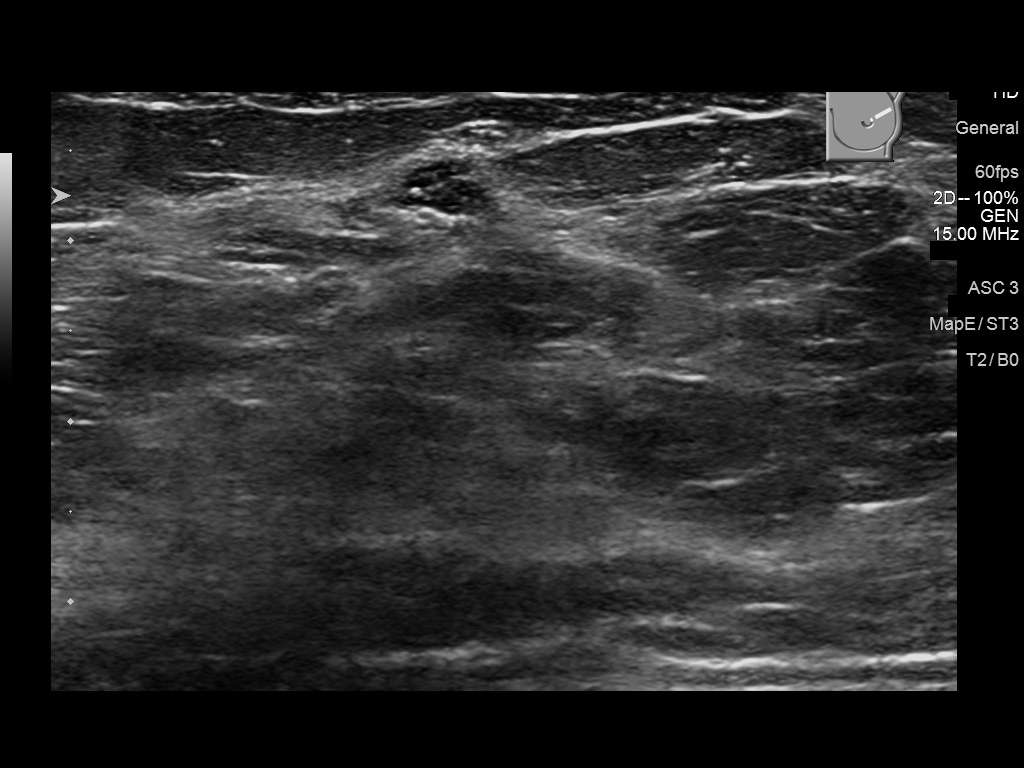
[im 5/6]
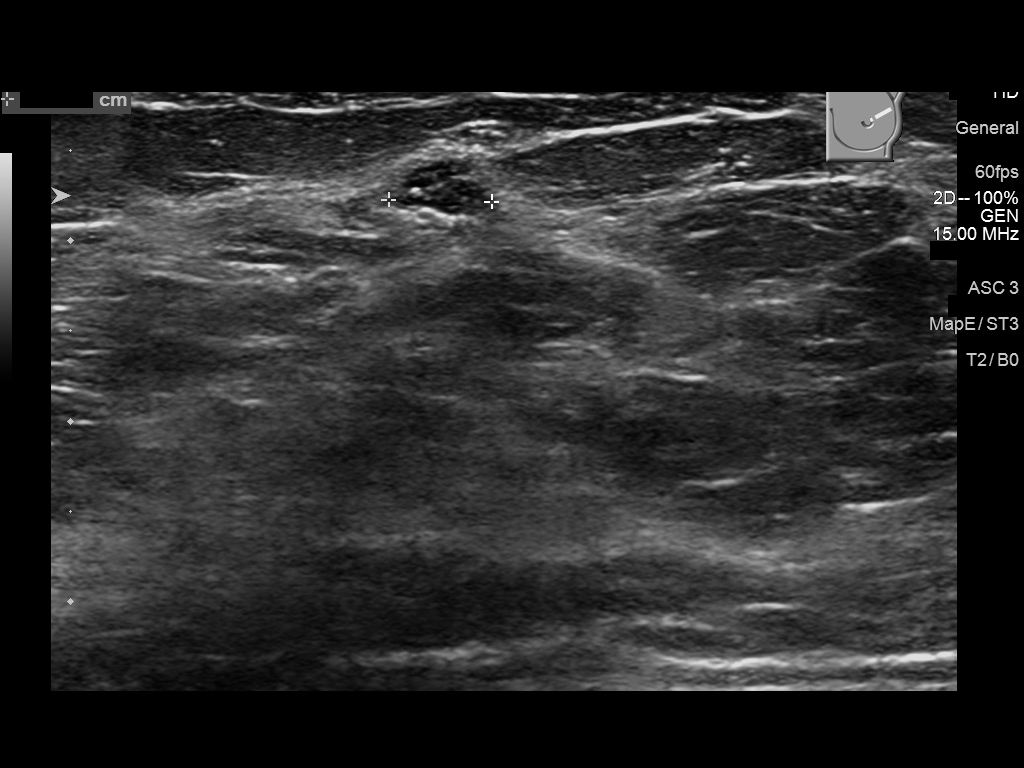
[im 6/6]
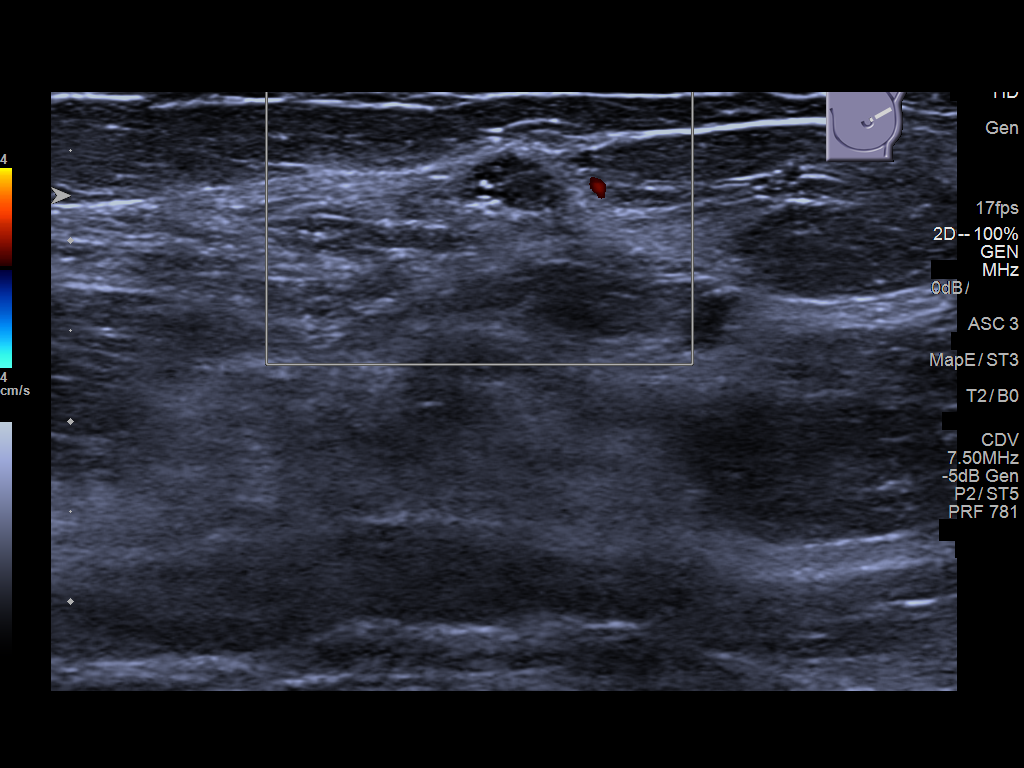

[6 of 6 positions shown; findings below may reference images not displayed]

ACR Breast Density Category b: There are scattered areas of
fibroglandular density.
FINDINGS: No suspicious mass or malignant type microcalcifications identified
in either breast. Asymmetric fibroglandular tissue in the lateral
aspect of the left breast is stable.

Mammographic images were processed with CAD.

On physical exam, no mass is palpated in the 2 o'clock region of the
left breast 4 cm from the nipple.

Targeted ultrasound is performed, showing a stable
well-circumscribed hypoechoic lesion with cystic components
measuring 6 x 4 x 6 mm in the left breast at 2 o'clock 4 cm from the
nipple.
IMPRESSION: Stable probable benign fibrocystic changes in the left breast.

RECOMMENDATION:
Bilateral diagnostic mammogram and left breast ultrasound in 1 year
is recommended to complete the 2 year follow-up.

I have discussed the findings and recommendations with the patient.
Results were also provided in writing at the conclusion of the
visit. If applicable, a reminder letter will be sent to the patient
regarding the next appointment.

BI-RADS CATEGORY  3: Probably benign.
